# Patient Record
Sex: Female | Born: 1990 | Race: Black or African American | Hispanic: No | Marital: Single | Smoking: Never smoker
Health system: Southern US, Community
[De-identification: ages and names within clinical notes are randomized; demographics above are authoritative.]

## PROBLEM LIST (undated history)

## (undated) ENCOUNTER — Inpatient Hospital Stay (HOSPITAL_COMMUNITY): Payer: Self-pay

## (undated) DIAGNOSIS — D649 Anemia, unspecified: Secondary | ICD-10-CM

## (undated) DIAGNOSIS — Z5189 Encounter for other specified aftercare: Secondary | ICD-10-CM

## (undated) DIAGNOSIS — T8859XA Other complications of anesthesia, initial encounter: Secondary | ICD-10-CM

## (undated) HISTORY — PX: NO PAST SURGERIES: SHX2092

---

## 2015-12-21 LAB — GLUCOSE, POCT (MANUAL RESULT ENTRY): POC Glucose: 94 mg/dl (ref 70–99)

## 2016-06-03 ENCOUNTER — Encounter (HOSPITAL_COMMUNITY): Payer: Self-pay | Admitting: Emergency Medicine

## 2016-06-03 ENCOUNTER — Ambulatory Visit (HOSPITAL_COMMUNITY)
Admission: EM | Admit: 2016-06-03 | Discharge: 2016-06-03 | Disposition: A | Discharge status: Home / Self Care | Payer: Self-pay | Attending: Family Medicine | Admitting: Family Medicine

## 2016-06-03 DIAGNOSIS — A084 Viral intestinal infection, unspecified: Secondary | ICD-10-CM

## 2016-06-03 MED ORDER — ONDANSETRON 8 MG PO TBDP: 8.0000 mg | ORAL_TABLET | Freq: Three times a day (TID) | ORAL | 0 refills | Status: DC | PRN

## 2016-06-03 NOTE — ED Triage Notes (Signed)
The patient presented to the [facility] with a complaint of abdominal cramping and N/V/D that started today.

## 2016-06-03 NOTE — ED Provider Notes (Signed)
[place]    CSN: [id number] Arrival date & time: 06/03/16  [place]     History   Chief Complaint Chief Complaint  Patient presents with  . Abdominal Cramping    HPI Kimberly Morris is a 26 y.o. female.   The patient presented to the [facility] with a complaint of abdominal cramping and N/V/D that started today. 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 She is a single mother. She left work early today.  Patient's had some cramps along with nausea, vomiting, and diarrhea. Is been no blood in the vomiting or diarrhea. She's tried to take sips of clear liquids but each time she takes even a sip she gets sick. The last time she vomited was about an hour prior to arrival. She has no ongoing problems with her stomach.      History reviewed. No pertinent past medical history.  There are no active problems to display for this patient.   History reviewed. No pertinent surgical history.  OB History    No data available       Home Medications    Prior to Admission medications   Medication Sig Start Date End Date Taking? Authorizing Provider  ondansetron (ZOFRAN-ODT) 8 MG disintegrating tablet Take 1 tablet (8 mg total) by mouth every 8 (eight) hours as needed for nausea. 06/03/16   Robyn Haber, MD    Family History History reviewed. No pertinent family history.  Social History Social History  Substance Use Topics  . Smoking status: Never Smoker  . Smokeless tobacco: Never Used  . Alcohol use Yes     Comment: Occ     Allergies   Patient has no known allergies.   Review of Systems Review of Systems  Constitutional: Positive for fatigue.  HENT: Negative.   Respiratory: Negative.   Cardiovascular: Negative.   Gastrointestinal: Positive for abdominal pain, diarrhea, nausea and vomiting.  Genitourinary: Negative.   Musculoskeletal: Positive for myalgias.  Neurological: Negative.      Physical Exam Triage Vital Signs ED Triage Vitals  Enc  Vitals Group     BP      Pulse      Resp      Temp      Temp src      SpO2      Weight      Height      Head Circumference      Peak Flow      Pain Score      Pain Loc      Pain Edu?      Excl. in [place]?    No data found.   Updated Vital Signs BP 111/64 (BP Location: Left Arm)   Pulse 100   Temp 100.5 F (38.1 C) (Oral)   Resp 16   LMP 05/12/2016 (Approximate)   SpO2 100%    Physical Exam  Constitutional: She is oriented to person, place, and time. She appears well-developed and well-nourished.  HENT:  Right Ear: External ear normal.  Left Ear: External ear normal.  Mouth/Throat: Oropharynx is clear and moist.  Eyes: Conjunctivae and EOM are normal. Pupils are equal, round, and reactive to light.  Neck: Normal range of motion. Neck supple.  Cardiovascular: Normal rate, regular rhythm and normal heart sounds.   Pulmonary/Chest: Effort normal and breath sounds normal.  Abdominal: Soft. Bowel sounds are normal. She exhibits no distension. There is no tenderness. There is no rebound and no guarding.  Musculoskeletal: Normal range  of motion.  Neurological: She is alert and oriented to person, place, and time.  Skin: Skin is warm and dry.  Nursing note and vitals reviewed.    UC Treatments / Results  Labs (all labs ordered are listed, but only abnormal results are displayed) Labs Reviewed - No data to display  EKG  EKG Interpretation None       Radiology No results found.  Procedures Procedures (including critical care time)  Medications Ordered in UC Medications - No data to display   Initial Impression / Assessment and Plan / UC Course  I have reviewed the triage vital signs and the nursing notes.  Pertinent labs & imaging results that were available during my care of the patient were reviewed by me and considered in my medical decision making (see chart for details).     Final Clinical Impressions(s) / UC Diagnoses   Final diagnoses:  Viral  gastroenteritis    New Prescriptions New Prescriptions   ONDANSETRON (ZOFRAN-ODT) 8 MG DISINTEGRATING TABLET    Take 1 tablet (8 mg total) by mouth every 8 (eight) hours as needed for nausea.     Robyn Haber, MD 06/03/16 [id number]

## 2017-01-09 ENCOUNTER — Inpatient Hospital Stay (HOSPITAL_COMMUNITY)
Admission: AD | Admit: 2017-01-09 | Discharge: 2017-01-09 | Disposition: A | Discharge status: Home / Self Care | Payer: Medicaid Other | Source: Ambulatory Visit | Attending: Obstetrics & Gynecology | Admitting: Obstetrics & Gynecology

## 2017-01-09 ENCOUNTER — Encounter (HOSPITAL_COMMUNITY): Payer: Self-pay | Admitting: *Deleted

## 2017-01-09 DIAGNOSIS — K529 Noninfective gastroenteritis and colitis, unspecified: Secondary | ICD-10-CM | POA: Insufficient documentation

## 2017-01-09 DIAGNOSIS — R1010 Upper abdominal pain, unspecified: Secondary | ICD-10-CM | POA: Diagnosis present

## 2017-01-09 DIAGNOSIS — Z3A01 Less than 8 weeks gestation of pregnancy: Secondary | ICD-10-CM | POA: Diagnosis not present

## 2017-01-09 DIAGNOSIS — O9989 Other specified diseases and conditions complicating pregnancy, childbirth and the puerperium: Secondary | ICD-10-CM

## 2017-01-09 DIAGNOSIS — E876 Hypokalemia: Secondary | ICD-10-CM | POA: Diagnosis not present

## 2017-01-09 DIAGNOSIS — O99611 Diseases of the digestive system complicating pregnancy, first trimester: Secondary | ICD-10-CM | POA: Insufficient documentation

## 2017-01-09 HISTORY — DX: Anemia, unspecified: D64.9

## 2017-01-09 LAB — COMPREHENSIVE METABOLIC PANEL
ALBUMIN: 4 g/dL (ref 3.5–5.0)
ALT: 14 U/L (ref 14–54)
AST: 20 U/L (ref 15–41)
Alkaline Phosphatase: 46 U/L (ref 38–126)
Anion gap: 6 (ref 5–15)
BUN: 6 mg/dL (ref 6–20)
CHLORIDE: 108 mmol/L (ref 101–111)
CO2: 25 mmol/L (ref 22–32)
Calcium: 9.1 mg/dL (ref 8.9–10.3)
Creatinine, Ser: 0.64 mg/dL (ref 0.44–1.00)
GFR calc Af Amer: >60 mL/min (ref >60)
Glucose, Bld: 116 mg/dL — ABNORMAL HIGH (ref 65–99)
POTASSIUM: 3.3 mmol/L — AB (ref 3.5–5.1)
Sodium: 139 mmol/L (ref 135–145)
Total Bilirubin: 0.5 mg/dL (ref 0.3–1.2)
Total Protein: 6.9 g/dL (ref 6.5–8.1)

## 2017-01-09 LAB — URINALYSIS, ROUTINE W REFLEX MICROSCOPIC
Bilirubin Urine: NEGATIVE
GLUCOSE, UA: NEGATIVE mg/dL
Hgb urine dipstick: NEGATIVE
Ketones, ur: 5 mg/dL — AB
NITRITE: NEGATIVE
PROTEIN: NEGATIVE mg/dL
Specific Gravity, Urine: 1.025 (ref 1.005–1.030)
pH: 5 (ref 5.0–8.0)

## 2017-01-09 LAB — CBC
HCT: 35.2 % — ABNORMAL LOW (ref 36.0–46.0)
Hemoglobin: 12 g/dL (ref 12.0–15.0)
MCH: 28.5 pg (ref 26.0–34.0)
MCHC: 34.1 g/dL (ref 30.0–36.0)
MCV: 83.6 fL (ref 78.0–100.0)
PLATELETS: 232 10*3/uL (ref 150–400)
RBC: 4.21 MIL/uL (ref 3.87–5.11)
RDW: 15.1 % (ref 11.5–15.5)
WBC: 5.7 10*3/uL (ref 4.0–10.5)

## 2017-01-09 LAB — LIPASE, BLOOD: LIPASE: 22 U/L (ref 11–51)

## 2017-01-09 LAB — POCT PREGNANCY, URINE: PREG TEST UR: POSITIVE — AB

## 2017-01-09 MED ORDER — PROMETHAZINE HCL 25 MG PO TABS: 25.0000 mg | ORAL_TABLET | Freq: Four times a day (QID) | ORAL | 0 refills | Status: DC | PRN

## 2017-01-09 NOTE — Discharge Instructions (Signed)
Hypokalemia Hypokalemia means that the amount of potassium in the blood is lower than normal.Potassium is a chemical that helps regulate the amount of fluid in the body (electrolyte). It also stimulates muscle tightening (contraction) and helps nerves work properly.Normally, most of the bodys potassium is inside of cells, and only a very small amount is in the blood. Because the amount in the blood is so small, minor changes to potassium levels in the blood can be life-threatening. What are the causes? This condition may be caused by:  Antibiotic medicine.  Diarrhea or vomiting. Taking too much of a medicine that helps you have a bowel movement (laxative) can cause diarrhea and lead to hypokalemia.  Chronic kidney disease (CKD).  Medicines that help the body get rid of excess fluid (diuretics).  Eating disorders, such as bulimia.  Low magnesium levels in the body.  Sweating a lot.  What are the signs or symptoms? Symptoms of this condition include:  Weakness.  Constipation.  Fatigue.  Muscle cramps.  Mental confusion.  Skipped heartbeats or irregular heartbeat (palpitations).  Tingling or numbness.  How is this diagnosed? This condition is diagnosed with a blood test. How is this treated? Hypokalemia can be treated by taking potassium supplements by mouth or adjusting the medicines that you take. Treatment may also include eating more foods that contain a lot of potassium. If your potassium level is very low, you may need to get potassium through an IV tube in one of your veins and be monitored in the hospital. Follow these instructions at home:  Take over-the-counter and prescription medicines only as told by your health care provider. This includes vitamins and supplements.  Eat a healthy diet. A healthy diet includes fresh fruits and vegetables, whole grains, healthy fats, and lean proteins.  If instructed, eat more foods that contain a lot of potassium, such  as: ? Nuts, such as peanuts and pistachios. ? Seeds, such as sunflower seeds and pumpkin seeds. ? Peas, lentils, and lima beans. ? Whole grain and bran cereals and breads. ? Fresh fruits and vegetables, such as apricots, avocado, bananas, cantaloupe, kiwi, oranges, tomatoes, asparagus, and potatoes. ? Orange juice. ? Tomato juice. ? Red meats. ? Yogurt.  Keep all follow-up visits as told by your health care provider. This is important. Contact a health care provider if:  You have weakness that gets worse.  You feel your heart pounding or racing.  You vomit.  You have diarrhea.  You have diabetes (diabetes mellitus) and you have trouble keeping your blood sugar (glucose) in your target range. Get help right away if:  You have chest pain.  You have shortness of breath.  You have vomiting or diarrhea that lasts for more than 2 days.  You faint. This information is not intended to replace advice given to you by your health care provider. Make sure you discuss any questions you have with your health care provider. Document Released: 03/24/2005 Document Revised: 11/10/2015 Document Reviewed: 11/10/2015 Elsevier Interactive Patient Education  2018 [place] Choices to Help Relieve Diarrhea, Adult When you have diarrhea, the foods you eat and your eating habits are very important. Choosing the right foods and drinks can help:  Relieve diarrhea.  Replace lost fluids and nutrients.  Prevent dehydration.  What general guidelines should I follow? Relieving diarrhea  Choose foods with less than 2 g or .07 oz. of fiber per serving.  Limit fats to less than 8 tsp (38 g or 1.34 oz.) a day.  Avoid the following: ? Foods and beverages sweetened with high-fructose corn syrup, honey, or sugar alcohols such as xylitol, sorbitol, and mannitol. ? Foods that contain a lot of fat or sugar. ? Fried, greasy, or spicy foods. ? High-fiber grains, breads, and cereals. ? Raw fruits  and vegetables.  Eat foods that are rich in probiotics. These foods include dairy products such as yogurt and fermented milk products. They help increase healthy bacteria in the stomach and intestines (gastrointestinal tract, or GI tract).  If you have lactose intolerance, avoid dairy products. These may make your diarrhea worse.  Take medicine to help stop diarrhea (antidiarrheal medicine) only as told by your health care provider. Replacing nutrients  Eat small meals or snacks every 3-4 hours.  Eat bland foods, such as white rice, toast, or baked potato, until your diarrhea starts to get better. Gradually reintroduce nutrient-rich foods as tolerated or as told by your health care provider. This includes: ? Well-cooked protein foods. ? Peeled, seeded, and soft-cooked fruits and vegetables. ? Low-fat dairy products.  Take vitamin and mineral supplements as told by your health care provider. Preventing dehydration   Start by sipping water or a special solution to prevent dehydration (oral rehydration solution, ORS). Urine that is clear or pale yellow means that you are getting enough fluid.  Try to drink at least 8-10 cups of fluid each day to help replace lost fluids.  You may add other liquids in addition to water, such as clear juice or decaffeinated sports drinks, as tolerated or as told by your health care provider.  Avoid drinks with caffeine, such as coffee, tea, or soft drinks.  Avoid alcohol. What foods are recommended? The items listed may not be a complete list. Talk with your health care provider about what dietary choices are best for you. Grains White rice. White, [place], or pita breads (fresh or toasted), including plain rolls, buns, or bagels. White pasta. Saltine, soda, or graham crackers. Pretzels. Low-fiber cereal. Cooked cereals made with water (such as cornmeal, farina, or cream cereals). Plain muffins. Matzo. Melba toast. Zwieback. Vegetables Potatoes (without  the skin). Most well-cooked and canned vegetables without skins or seeds. Tender lettuce. Fruits Apple sauce. Fruits canned in juice. Cooked apricots, cherries, grapefruit, peaches, pears, or plums. Fresh bananas and cantaloupe. Meats and other protein foods Baked or boiled chicken. Eggs. Tofu. Fish. Seafood. Smooth nut butters. Ground or well-cooked tender beef, ham, veal, lamb, pork, or poultry. Dairy Plain yogurt, kefir, and unsweetened liquid yogurt. Lactose-free milk, buttermilk, skim milk, or soy milk. Low-fat or nonfat hard cheese. Beverages Water. Low-calorie sports drinks. Fruit juices without pulp. Strained tomato and vegetable juices. Decaffeinated teas. Sugar-free beverages not sweetened with sugar alcohols. Oral rehydration solutions, if approved by your health care provider. Seasoning and other foods Bouillon, broth, or soups made from recommended foods. What foods are not recommended? The items listed may not be a complete list. Talk with your health care provider about what dietary choices are best for you. Grains Whole grain, whole wheat, bran, or rye breads, rolls, pastas, and crackers. Wild or brown rice. Whole grain or bran cereals. Barley. Oats and oatmeal. Corn tortillas or taco shells. Granola. Popcorn. Vegetables Raw vegetables. Fried vegetables. Cabbage, broccoli, Brussels sprouts, artichokes, baked beans, beet greens, corn, kale, legumes, peas, sweet potatoes, and yams. Potato skins. Cooked spinach and cabbage. Fruits Dried fruit, including raisins and dates. Raw fruits. Stewed or dried prunes. Canned fruits with syrup. Meat and other protein foods Fried or fatty  meats. Deli meats. Chunky nut butters. Nuts and seeds. Beans and lentils. Berniece Salines. Hot dogs. Sausage. Dairy High-fat cheeses. Whole milk, chocolate milk, and beverages made with milk, such as milk shakes. Half-and-half. Cream. sour cream. Ice cream. Beverages Caffeinated beverages (such as coffee, tea, soda,  or energy drinks). Alcoholic beverages. Fruit juices with pulp. Prune juice. Soft drinks sweetened with high-fructose corn syrup or sugar alcohols. High-calorie sports drinks. Fats and oils Butter. Cream sauces. Margarine. Salad oils. Plain salad dressings. Olives. Avocados. Mayonnaise. Sweets and desserts Sweet rolls, doughnuts, and sweet breads. Sugar-free desserts sweetened with sugar alcohols such as xylitol and sorbitol. Seasoning and other foods Honey. Hot sauce. Chili powder. Gravy. Cream-based or milk-based soups. Pancakes and waffles. Summary  When you have diarrhea, the foods you eat and your eating habits are very important.  Make sure you get at least 8-10 cups of fluid each day, or enough to keep your urine clear or pale yellow.  Eat bland foods and gradually reintroduce healthy, nutrient-rich foods as tolerated, or as told by your health care provider.  Avoid high-fiber, fried, greasy, or spicy foods. This information is not intended to replace advice given to you by your health care provider. Make sure you discuss any questions you have with your health care provider. Document Released: 06/14/2003 Document Revised: 03/21/2016 Document Reviewed: 03/21/2016 Elsevier Interactive Patient Education  2017 Elsevier Inc. Viral Gastroenteritis, Adult Viral gastroenteritis is also known as the stomach flu. This condition is caused by certain germs (viruses). These germs can be passed from person to person very easily (are very contagious). This condition can cause sudden watery poop (diarrhea), fever, and throwing up (vomiting). Having watery poop and throwing up can make you feel weak and cause you to get dehydrated. Dehydration can make you tired and thirsty, make you have a dry mouth, and make it so you pee (urinate) less often. Older adults and people with other diseases or a weak defense system (immune system) are at higher risk for dehydration. It is important to replace the fluids  that you lose from having watery poop and throwing up. Follow these instructions at home: Follow instructions from your doctor about how to care for yourself at home. Eating and drinking  Follow these instructions as told by your doctor:  Take an oral rehydration solution (ORS). This is a drink that is sold at pharmacies and stores.  Drink clear fluids in small amounts as you are able, such as: ? Water. ? Ice chips. ? Diluted fruit juice. ? Low-calorie sports drinks.  Eat bland, easy-to-digest foods in small amounts as you are able, such as: ? Bananas. ? Applesauce. ? Rice. ? Low-fat (lean) meats. ? Toast. ? Crackers.  Avoid fluids that have a lot of sugar or caffeine in them.  Avoid alcohol.  Avoid spicy or fatty foods.  General instructions  Drink enough fluid to keep your pee (urine) clear or pale yellow.  Wash your hands often. If you cannot use soap and water, use hand sanitizer.  Make sure that all people in your home wash their hands well and often.  Rest at home while you get better.  Take over-the-counter and prescription medicines only as told by your doctor.  Watch your condition for any changes.  Take a warm bath to help with any burning or pain from having watery poop.  Keep all follow-up visits as told by your doctor. This is important. Contact a doctor if:  You cannot keep fluids down.  Your symptoms  get worse.  You have new symptoms.  You feel light-headed or dizzy.  You have muscle cramps. Get help right away if:  You have chest pain.  You feel very weak or you pass out (faint).  You see blood in your throw-up.  Your throw-up looks like coffee grounds.  You have bloody or black poop (stools) or poop that look like tar.  You have a very bad headache, a stiff neck, or both.  You have a rash.  You have very bad pain, cramping, or bloating in your belly (abdomen).  You have trouble breathing.  You are breathing very  quickly.  Your heart is beating very quickly.  Your skin feels cold and clammy.  You feel confused.  You have pain when you pee.  You have signs of dehydration, such as: ? Dark pee, hardly any pee, or no pee. ? Cracked lips. ? Dry mouth. ? Sunken eyes. ? Sleepiness. ? Weakness. This information is not intended to replace advice given to you by your health care provider. Make sure you discuss any questions you have with your health care provider. Document Released: 09/10/2007 Document Revised: 10/12/2015 Document Reviewed: 11/28/2014 Elsevier Interactive Patient Education  2017 [employer].

## 2017-01-09 NOTE — MAU Note (Addendum)
Thinks she has a UTI. Last couple wks, frequency and urgency, initially some pain with urination- none now. Was vomiting this morning.  Burning pain in upper abd. Period is 2 wks late.  Has not done a test.  Was up during the night with diarrhea, none since 0700.

## 2017-01-09 NOTE — MAU Provider Note (Signed)
[place]     CSN: [id number]  Arrival date and time: 01/09/17 1430   First Provider Initiated Contact with Patient 01/09/17 1615      Chief Complaint  Patient presents with  . Emesis  . Abdominal Pain  . Possible Pregnancy  . Urinary Frequency   [id number] @[redacted]w[redacted]d  by LMP here with N/V/D and upper abdominal pain. Sx occurred this am between 0400-0700. She had 4 episodes of vomiting and 3 episodes of diarrhea. The pain was in the epigastric region and describes as burning. She has eaten french fries and had water since and was able to tolerate. No fevers. Her boyfriend had diarrhea 3 days ago. She also reports dysuria and urgency that started about 2 weeks ago. The dysuria has since resolved but still having urgency. Denies VB or lower abdominal pain. She thinks she may be pregnant.   Past Medical History:  Diagnosis Date  . Anemia     Past Surgical History:  Procedure Laterality Date  . NO PAST SURGERIES      History reviewed. No pertinent family history.  Social History  Substance Use Topics  . Smoking status: Never Smoker  . Smokeless tobacco: Never Used  . Alcohol use Yes     Comment: Occ    Allergies: No Known Allergies  Prescriptions Prior to Admission  Medication Sig Dispense Refill Last Dose  . ondansetron (ZOFRAN-ODT) 8 MG disintegrating tablet Take 1 tablet (8 mg total) by mouth every 8 (eight) hours as needed for nausea. 12 tablet 0     Review of Systems  Constitutional: Negative for fever.  Gastrointestinal: Positive for abdominal pain, diarrhea, nausea and vomiting.  Genitourinary: Negative for vaginal bleeding.   Physical Exam   Blood pressure 110/61, pulse (!) 101, temperature 98.9 F (37.2 C), temperature source Oral, resp. rate 16, height 5' 2.5" (1.588 m), weight 136 lb 12 oz (62 kg), last menstrual period 12/04/2016, SpO2 100 %.  Physical Exam  Constitutional: She is oriented to person, place, and time. She appears well-developed and  well-nourished.  HENT:  Head: Normocephalic and atraumatic.  Neck: Normal range of motion.  Respiratory: Effort normal.  GI: Soft. She exhibits no distension and no mass. There is no tenderness. There is no rebound and no guarding.  Musculoskeletal: Normal range of motion.  Neurological: She is alert and oriented to person, place, and time.  Skin: Skin is warm and dry.  Psychiatric: She has a normal mood and affect.   Results for orders placed or performed during the hospital encounter of 01/09/17 (from the past 24 hour(s))  Urinalysis, Routine w reflex microscopic     Status: Abnormal   Collection Time: 01/09/17  2:53 PM  Result Value Ref Range   Color, Urine YELLOW YELLOW   APPearance CLOUDY (A) CLEAR   Specific Gravity, Urine 1.025 1.005 - 1.030   pH 5.0 5.0 - 8.0   Glucose, UA NEGATIVE NEGATIVE mg/dL   Hgb urine dipstick NEGATIVE NEGATIVE   Bilirubin Urine NEGATIVE NEGATIVE   Ketones, ur 5 (A) NEGATIVE mg/dL   Protein, ur NEGATIVE NEGATIVE mg/dL   Nitrite NEGATIVE NEGATIVE   Leukocytes, UA MODERATE (A) NEGATIVE   RBC / HPF 0-5 0 - 5 RBC/hpf   WBC, UA 6-30 0 - 5 WBC/hpf   Bacteria, UA FEW (A) NONE SEEN   Squamous Epithelial / LPF TOO NUMEROUS TO COUNT (A) NONE SEEN   Mucus PRESENT    Sperm, UA PRESENT   Pregnancy, urine POC  Status: Abnormal   Collection Time: 01/09/17  3:19 PM  Result Value Ref Range   Preg Test, Ur POSITIVE (A) NEGATIVE  CBC     Status: Abnormal   Collection Time: 01/09/17  3:51 PM  Result Value Ref Range   WBC 5.7 4.0 - 10.5 K/uL   RBC 4.21 3.87 - 5.11 MIL/uL   Hemoglobin 12.0 12.0 - 15.0 g/dL   HCT 35.2 (L) 36.0 - 46.0 %   MCV 83.6 78.0 - 100.0 fL   MCH 28.5 26.0 - 34.0 pg   MCHC 34.1 30.0 - 36.0 g/dL   RDW 15.1 11.5 - 15.5 %   Platelets 232 150 - 400 K/uL  Comprehensive metabolic panel     Status: Abnormal   Collection Time: 01/09/17  3:51 PM  Result Value Ref Range   Sodium 139 135 - 145 mmol/L   Potassium 3.3 (L) 3.5 - 5.1 mmol/L    Chloride 108 101 - 111 mmol/L   CO2 25 22 - 32 mmol/L   Glucose, Bld 116 (H) 65 - 99 mg/dL   BUN 6 6 - 20 mg/dL   Creatinine, Ser 0.64 0.44 - 1.00 mg/dL   Calcium 9.1 8.9 - 10.3 mg/dL   Total Protein 6.9 6.5 - 8.1 g/dL   Albumin 4.0 3.5 - 5.0 g/dL   AST 20 15 - 41 U/L   ALT 14 14 - 54 U/L   Alkaline Phosphatase 46 38 - 126 U/L   Total Bilirubin 0.5 0.3 - 1.2 mg/dL   GFR calc non Af Amer >60 >60 mL/min   GFR calc Af Amer >60 >60 mL/min   Anion gap 6 5 - 15  Lipase, blood     Status: None   Collection Time: 01/09/17  3:51 PM  Result Value Ref Range   Lipase 22 11 - 51 U/L   MAU Course  Procedures  MDM Labs ordered and reviewed. Pt currently asymptomatic. Likely viral gastroenteritis. Discussed support measures. Rx for Phenergan provided. May use Imodium OTC prn. Stable for discharge home.  Assessment and Plan   1. [redacted] weeks gestation of pregnancy   2. Gastroenteritis   3. Hypokalemia    Discharge home Follow up with OBGYN provider of choice Rx Phenergan BRAT diet Consume bananas to increase K  Allergies as of 01/09/2017   No Known Allergies     Medication List    STOP taking these medications   ondansetron 8 MG disintegrating tablet Commonly known as:  ZOFRAN-ODT     TAKE these medications   promethazine 25 MG tablet Commonly known as:  PHENERGAN Take 1 tablet (25 mg total) by mouth every 6 (six) hours as needed for nausea or vomiting.      Julianne Handler, CNM 01/09/2017, 4:48 PM

## 2017-01-09 NOTE — MAU Note (Signed)
Pt presents with c/o vomiting and diarrhea this morning.  Reports vomited x4 and 3 episodes of diarrhea.  Pt also reports signs of UTI, reports frequency & urgency, denies burning.

## 2017-01-10 LAB — CULTURE, OB URINE

## 2017-02-12 LAB — OB RESULTS CONSOLE GC/CHLAMYDIA: GC PROBE AMP, GENITAL: NEGATIVE

## 2017-02-12 LAB — OB RESULTS CONSOLE HIV ANTIBODY (ROUTINE TESTING): HIV: NONREACTIVE

## 2017-02-12 LAB — OB RESULTS CONSOLE HEPATITIS B SURFACE ANTIGEN: Hepatitis B Surface Ag: NEGATIVE

## 2017-02-12 LAB — OB RESULTS CONSOLE RUBELLA ANTIBODY, IGM: RUBELLA: IMMUNE

## 2017-05-28 LAB — OB RESULTS CONSOLE RPR: RPR: NONREACTIVE

## 2017-08-06 LAB — OB RESULTS CONSOLE GBS: GBS: NEGATIVE

## 2017-09-01 ENCOUNTER — Other Ambulatory Visit: Payer: Self-pay

## 2017-09-01 ENCOUNTER — Inpatient Hospital Stay (HOSPITAL_COMMUNITY)
Admission: AD | Admit: 2017-09-01 | Discharge: 2017-09-01 | Disposition: A | Discharge status: Home / Self Care | Payer: Medicaid Other | Source: Ambulatory Visit | Attending: Family Medicine | Admitting: Family Medicine

## 2017-09-01 ENCOUNTER — Encounter (HOSPITAL_COMMUNITY): Payer: Self-pay

## 2017-09-01 DIAGNOSIS — O479 False labor, unspecified: Secondary | ICD-10-CM | POA: Diagnosis not present

## 2017-09-01 DIAGNOSIS — Z3A00 Weeks of gestation of pregnancy not specified: Secondary | ICD-10-CM | POA: Diagnosis not present

## 2017-09-01 NOTE — MAU Note (Signed)
Pt reports she was having some nausea and some bloody discharge on the tissue at around 4, lower back pain off/on. Was told to come in for eval.

## 2017-09-01 NOTE — Progress Notes (Addendum)
G6P3 @ 38.[redacted] wksga. Here for labor eval with c/o bloody vaginal discharge and lower back pain that comes and goes. +FM  efm applied.   VE: 1/thick/high . No blood noted   1837: Provider notified. Report given. Orders received to d/c pt home on labor precaution.   1847: D/c instructions given with pt understanding. Pt left unit via ambulatory with SO.

## 2017-09-01 NOTE — Discharge Instructions (Signed)
Braxton Hicks Contractions °Contractions of the uterus can occur throughout pregnancy, but they are not always a sign that you are in labor. You may have practice contractions called Braxton Hicks contractions. These false labor contractions are sometimes confused with true labor. °What are Braxton Hicks contractions? °Braxton Hicks contractions are tightening movements that occur in the muscles of the uterus before labor. Unlike true labor contractions, these contractions do not result in opening (dilation) and thinning of the cervix. Toward the end of pregnancy (32-34 weeks), Braxton Hicks contractions can happen more often and may become stronger. These contractions are sometimes difficult to tell apart from true labor because they can be very uncomfortable. You should not feel embarrassed if you go to the hospital with false labor. °Sometimes, the only way to tell if you are in true labor is for your health care provider to look for changes in the cervix. The health care provider will do a physical exam and may monitor your contractions. If you are not in true labor, the exam should show that your cervix is not dilating and your water has not broken. °If there are other health problems associated with your pregnancy, it is completely safe for you to be sent home with false labor. You may continue to have Braxton Hicks contractions until you go into true labor. °How to tell the difference between true labor and false labor °True labor °· Contractions last 30-70 seconds. °· Contractions become very regular. °· Discomfort is usually felt in the top of the uterus, and it spreads to the lower abdomen and low back. °· Contractions do not go away with walking. °· Contractions usually become more intense and increase in frequency. °· The cervix dilates and gets thinner. °False labor °· Contractions are usually shorter and not as strong as true labor contractions. °· Contractions are usually irregular. °· Contractions  are often felt in the front of the lower abdomen and in the groin. °· Contractions may go away when you walk around or change positions while lying down. °· Contractions get weaker and are shorter-lasting as time goes on. °· The cervix usually does not dilate or become thin. °Follow these instructions at home: °· Take over-the-counter and prescription medicines only as told by your health care provider. °· Keep up with your usual exercises and follow other instructions from your health care provider. °· Eat and drink lightly if you think you are going into labor. °· If Braxton Hicks contractions are making you uncomfortable: °? Change your position from lying down or resting to walking, or change from walking to resting. °? Sit and rest in a tub of warm water. °? Drink enough fluid to keep your urine pale yellow. Dehydration may cause these contractions. °? Do slow and deep breathing several times an hour. °· Keep all follow-up prenatal visits as told by your health care provider. This is important. °Contact a health care provider if: °· You have a fever. °· You have continuous pain in your abdomen. °Get help right away if: °· Your contractions become stronger, more regular, and closer together. °· You have fluid leaking or gushing from your vagina. °· You pass blood-tinged mucus (bloody show). °· You have bleeding from your vagina. °· You have low back pain that you never had before. °· You feel your baby’s head pushing down and causing pelvic pressure. °· Your baby is not moving inside you as much as it used to. °Summary °· Contractions that occur before labor are called Braxton   Hicks contractions, false labor, or practice contractions. °· Braxton Hicks contractions are usually shorter, weaker, farther apart, and less regular than true labor contractions. True labor contractions usually become progressively stronger and regular and they become more frequent. °· Manage discomfort from Braxton Hicks contractions by  changing position, resting in a warm bath, drinking plenty of water, or practicing deep breathing. °This information is not intended to replace advice given to you by your health care provider. Make sure you discuss any questions you have with your health care provider. °Document Released: 08/07/2016 Document Revised: 08/07/2016 Document Reviewed: 08/07/2016 °Elsevier Interactive Patient Education © 2018 [employer] Inc[employer] ° °

## 2017-09-09 ENCOUNTER — Encounter (HOSPITAL_COMMUNITY): Payer: Self-pay | Admitting: *Deleted

## 2017-09-09 ENCOUNTER — Inpatient Hospital Stay (HOSPITAL_COMMUNITY)
Admission: AD | Admit: 2017-09-09 | Discharge: 2017-09-09 | Disposition: A | Discharge status: Home / Self Care | Payer: Medicaid Other | Source: Ambulatory Visit | Attending: Obstetrics and Gynecology | Admitting: Obstetrics and Gynecology

## 2017-09-09 ENCOUNTER — Other Ambulatory Visit: Payer: Self-pay

## 2017-09-09 DIAGNOSIS — O479 False labor, unspecified: Secondary | ICD-10-CM

## 2017-09-09 DIAGNOSIS — O471 False labor at or after 37 completed weeks of gestation: Secondary | ICD-10-CM

## 2017-09-09 DIAGNOSIS — Z3A39 39 weeks gestation of pregnancy: Secondary | ICD-10-CM | POA: Diagnosis not present

## 2017-09-09 NOTE — MAU Note (Signed)
PT SAYS SHE UC  STRONG SINCE [phone].   [place]- WITH FAMINA.   VE IN MAU- 1 CM.  DENIES HSV AND MRSA.  GBS- UNSURE.

## 2017-09-09 NOTE — Discharge Instructions (Signed)
Braxton Hicks Contractions °Contractions of the uterus can occur throughout pregnancy, but they are not always a sign that you are in labor. You may have practice contractions called Braxton Hicks contractions. These false labor contractions are sometimes confused with true labor. °What are Braxton Hicks contractions? °Braxton Hicks contractions are tightening movements that occur in the muscles of the uterus before labor. Unlike true labor contractions, these contractions do not result in opening (dilation) and thinning of the cervix. Toward the end of pregnancy (32-34 weeks), Braxton Hicks contractions can happen more often and may become stronger. These contractions are sometimes difficult to tell apart from true labor because they can be very uncomfortable. You should not feel embarrassed if you go to the hospital with false labor. °Sometimes, the only way to tell if you are in true labor is for your health care provider to look for changes in the cervix. The health care provider will do a physical exam and may monitor your contractions. If you are not in true labor, the exam should show that your cervix is not dilating and your water has not broken. °If there are other health problems associated with your pregnancy, it is completely safe for you to be sent home with false labor. You may continue to have Braxton Hicks contractions until you go into true labor. °How to tell the difference between true labor and false labor °True labor °· Contractions last 30-70 seconds. °· Contractions become very regular. °· Discomfort is usually felt in the top of the uterus, and it spreads to the lower abdomen and low back. °· Contractions do not go away with walking. °· Contractions usually become more intense and increase in frequency. °· The cervix dilates and gets thinner. °False labor °· Contractions are usually shorter and not as strong as true labor contractions. °· Contractions are usually irregular. °· Contractions  are often felt in the front of the lower abdomen and in the groin. °· Contractions may go away when you walk around or change positions while lying down. °· Contractions get weaker and are shorter-lasting as time goes on. °· The cervix usually does not dilate or become thin. °Follow these instructions at home: °· Take over-the-counter and prescription medicines only as told by your health care provider. °· Keep up with your usual exercises and follow other instructions from your health care provider. °· Eat and drink lightly if you think you are going into labor. °· If Braxton Hicks contractions are making you uncomfortable: °? Change your position from lying down or resting to walking, or change from walking to resting. °? Sit and rest in a tub of warm water. °? Drink enough fluid to keep your urine pale yellow. Dehydration may cause these contractions. °? Do slow and deep breathing several times an hour. °· Keep all follow-up prenatal visits as told by your health care provider. This is important. °Contact a health care provider if: °· You have a fever. °· You have continuous pain in your abdomen. °Get help right away if: °· Your contractions become stronger, more regular, and closer together. °· You have fluid leaking or gushing from your vagina. °· You pass blood-tinged mucus (bloody show). °· You have bleeding from your vagina. °· You have low back pain that you never had before. °· You feel your baby’s head pushing down and causing pelvic pressure. °· Your baby is not moving inside you as much as it used to. °Summary °· Contractions that occur before labor are called Braxton   Hicks contractions, false labor, or practice contractions. °· Braxton Hicks contractions are usually shorter, weaker, farther apart, and less regular than true labor contractions. True labor contractions usually become progressively stronger and regular and they become more frequent. °· Manage discomfort from Braxton Hicks contractions by  changing position, resting in a warm bath, drinking plenty of water, or practicing deep breathing. °This information is not intended to replace advice given to you by your health care provider. Make sure you discuss any questions you have with your health care provider. °Document Released: 08/07/2016 Document Revised: 08/07/2016 Document Reviewed: 08/07/2016 °Elsevier Interactive Patient Education © 2018 [employer] Inc[employer] ° °

## 2017-09-13 ENCOUNTER — Other Ambulatory Visit: Payer: Self-pay

## 2017-09-13 ENCOUNTER — Inpatient Hospital Stay (HOSPITAL_COMMUNITY): Payer: Medicaid Other | Admitting: Anesthesiology

## 2017-09-13 ENCOUNTER — Inpatient Hospital Stay (HOSPITAL_COMMUNITY)
Admission: AD | Admit: 2017-09-13 | Discharge: 2017-09-17 | DRG: 788 | Disposition: A | Discharge status: Home / Self Care | Payer: Medicaid Other | Attending: Obstetrics and Gynecology | Admitting: Obstetrics and Gynecology

## 2017-09-13 ENCOUNTER — Encounter (HOSPITAL_COMMUNITY): Payer: Self-pay

## 2017-09-13 DIAGNOSIS — O3413 Maternal care for benign tumor of corpus uteri, third trimester: Secondary | ICD-10-CM | POA: Diagnosis present

## 2017-09-13 DIAGNOSIS — IMO0002 Reserved for concepts with insufficient information to code with codable children: Secondary | ICD-10-CM

## 2017-09-13 DIAGNOSIS — D649 Anemia, unspecified: Secondary | ICD-10-CM | POA: Diagnosis present

## 2017-09-13 DIAGNOSIS — Z3490 Encounter for supervision of normal pregnancy, unspecified, unspecified trimester: Secondary | ICD-10-CM

## 2017-09-13 DIAGNOSIS — Z3A40 40 weeks gestation of pregnancy: Secondary | ICD-10-CM

## 2017-09-13 DIAGNOSIS — O368130 Decreased fetal movements, third trimester, not applicable or unspecified: Secondary | ICD-10-CM | POA: Diagnosis present

## 2017-09-13 DIAGNOSIS — D252 Subserosal leiomyoma of uterus: Secondary | ICD-10-CM | POA: Diagnosis present

## 2017-09-13 DIAGNOSIS — O34219 Maternal care for unspecified type scar from previous cesarean delivery: Secondary | ICD-10-CM

## 2017-09-13 DIAGNOSIS — O9902 Anemia complicating childbirth: Secondary | ICD-10-CM | POA: Diagnosis present

## 2017-09-13 DIAGNOSIS — O368390 Maternal care for abnormalities of the fetal heart rate or rhythm, unspecified trimester, not applicable or unspecified: Secondary | ICD-10-CM

## 2017-09-13 LAB — URINALYSIS, ROUTINE W REFLEX MICROSCOPIC
Bilirubin Urine: NEGATIVE
GLUCOSE, UA: NEGATIVE mg/dL
Hgb urine dipstick: NEGATIVE
KETONES UR: NEGATIVE mg/dL
Nitrite: NEGATIVE
Protein, ur: NEGATIVE mg/dL
Specific Gravity, Urine: 1.003 — ABNORMAL LOW (ref 1.005–1.030)
pH: 7 (ref 5.0–8.0)

## 2017-09-13 LAB — TYPE AND SCREEN
ABO/RH(D): O POS
ANTIBODY SCREEN: NEGATIVE

## 2017-09-13 LAB — CBC
HCT: 30.7 % — ABNORMAL LOW (ref 36.0–46.0)
Hemoglobin: 10.5 g/dL — ABNORMAL LOW (ref 12.0–15.0)
MCH: 28.5 pg (ref 26.0–34.0)
MCHC: 34.2 g/dL (ref 30.0–36.0)
MCV: 83.4 fL (ref 78.0–100.0)
PLATELETS: 176 10*3/uL (ref 150–400)
RBC: 3.68 MIL/uL — AB (ref 3.87–5.11)
RDW: 17.5 % — AB (ref 11.5–15.5)
WBC: 5.6 10*3/uL (ref 4.0–10.5)

## 2017-09-13 LAB — ABO/RH: ABO/RH(D): O POS

## 2017-09-13 MED ORDER — LACTATED RINGERS IV BOLUS
1000.0000 mL | Freq: Once | INTRAVENOUS | Status: AC
  Administered 2017-09-13: 1000 mL via INTRAVENOUS

## 2017-09-13 MED ORDER — OXYTOCIN BOLUS FROM INFUSION: 500.0000 mL | Freq: Once | INTRAVENOUS | Status: DC

## 2017-09-13 MED ORDER — FENTANYL 2.5 MCG/ML BUPIVACAINE 1/10 % EPIDURAL INFUSION (WH - ANES)
14.0000 mL/h | INTRAMUSCULAR | Status: DC | PRN
  Administered 2017-09-13 (×2): 14 mL/h via EPIDURAL
  Filled 2017-09-13 (×2): qty 100

## 2017-09-13 MED ORDER — PHENYLEPHRINE 40 MCG/ML (10ML) SYRINGE FOR IV PUSH (FOR BLOOD PRESSURE SUPPORT)
80.0000 ug | PREFILLED_SYRINGE | INTRAVENOUS | Status: DC | PRN
  Filled 2017-09-13: qty 10

## 2017-09-13 MED ORDER — EPHEDRINE 5 MG/ML INJ: 10.0000 mg | INTRAVENOUS | Status: DC | PRN

## 2017-09-13 MED ORDER — LACTATED RINGERS IV SOLN
INTRAVENOUS | Status: DC
  Administered 2017-09-13: 22:00:00 via INTRAUTERINE

## 2017-09-13 MED ORDER — PHENYLEPHRINE 40 MCG/ML (10ML) SYRINGE FOR IV PUSH (FOR BLOOD PRESSURE SUPPORT): 80.0000 ug | PREFILLED_SYRINGE | INTRAVENOUS | Status: DC | PRN

## 2017-09-13 MED ORDER — LACTATED RINGERS IV SOLN
500.0000 mL | INTRAVENOUS | Status: DC | PRN
  Administered 2017-09-13: 500 mL via INTRAVENOUS
  Administered 2017-09-13: 999 mL via INTRAVENOUS

## 2017-09-13 MED ORDER — LACTATED RINGERS IV SOLN
500.0000 mL | Freq: Once | INTRAVENOUS | Status: AC
  Administered 2017-09-13: 500 mL via INTRAVENOUS

## 2017-09-13 MED ORDER — LACTATED RINGERS IV SOLN
INTRAVENOUS | Status: DC
  Administered 2017-09-13: 21:00:00 via INTRAVENOUS

## 2017-09-13 MED ORDER — OXYTOCIN 40 UNITS IN LACTATED RINGERS INFUSION - SIMPLE MED: 2.5000 [IU]/h | INTRAVENOUS | Status: DC

## 2017-09-13 MED ORDER — ONDANSETRON HCL 4 MG/2ML IJ SOLN: 4.0000 mg | Freq: Four times a day (QID) | INTRAMUSCULAR | Status: DC | PRN

## 2017-09-13 MED ORDER — TERBUTALINE SULFATE 1 MG/ML IJ SOLN
0.2500 mg | Freq: Once | INTRAMUSCULAR | Status: AC | PRN
  Administered 2017-09-13: 0.25 mg via SUBCUTANEOUS
  Filled 2017-09-13: qty 1

## 2017-09-13 MED ORDER — ACETAMINOPHEN 325 MG PO TABS: 650.0000 mg | ORAL_TABLET | ORAL | Status: DC | PRN

## 2017-09-13 MED ORDER — LIDOCAINE HCL (PF) 1 % IJ SOLN: 30.0000 mL | INTRAMUSCULAR | Status: DC | PRN

## 2017-09-13 MED ORDER — OXYCODONE-ACETAMINOPHEN 5-325 MG PO TABS: 2.0000 | ORAL_TABLET | ORAL | Status: DC | PRN

## 2017-09-13 MED ORDER — SOD CITRATE-CITRIC ACID 500-334 MG/5ML PO SOLN
30.0000 mL | ORAL | Status: DC | PRN
  Administered 2017-09-13: 30 mL via ORAL
  Filled 2017-09-13: qty 15

## 2017-09-13 MED ORDER — DIPHENHYDRAMINE HCL 50 MG/ML IJ SOLN: 12.5000 mg | INTRAMUSCULAR | Status: DC | PRN

## 2017-09-13 MED ORDER — LIDOCAINE HCL (PF) 1 % IJ SOLN
INTRAMUSCULAR | Status: DC | PRN
  Administered 2017-09-13: 2 mL via EPIDURAL
  Administered 2017-09-13: 3 mL via EPIDURAL
  Administered 2017-09-13: 5 mL via EPIDURAL

## 2017-09-13 MED ORDER — OXYCODONE-ACETAMINOPHEN 5-325 MG PO TABS: 1.0000 | ORAL_TABLET | ORAL | Status: DC | PRN

## 2017-09-13 MED ORDER — OXYTOCIN 40 UNITS IN LACTATED RINGERS INFUSION - SIMPLE MED
1.0000 m[IU]/min | INTRAVENOUS | Status: DC
  Administered 2017-09-13: 2 m[IU]/min via INTRAVENOUS
  Filled 2017-09-13: qty 1000

## 2017-09-13 NOTE — Anesthesia Preprocedure Evaluation (Addendum)
Anesthesia Evaluation  Patient identified by MRN, date of birth, ID band Patient awake    Reviewed: Allergy & Precautions, NPO status , Patient's Chart, lab work & pertinent test results  Airway Mallampati: II  TM Distance: >3 FB Neck ROM: Full    Dental  (+) Teeth Intact, Dental Advisory Given   Pulmonary neg pulmonary ROS,    Pulmonary exam normal breath sounds clear to auscultation       Cardiovascular negative cardio ROS Normal cardiovascular exam Rhythm:Regular Rate:Normal     Neuro/Psych negative neurological ROS     GI/Hepatic negative GI ROS, Neg liver ROS,   Endo/Other  negative endocrine ROS  Renal/GU negative Renal ROS     Musculoskeletal negative musculoskeletal ROS (+)   Abdominal   Peds  Hematology  (+) Blood dyscrasia, anemia , Plt 176k   Anesthesia Other Findings Day of surgery medications reviewed with the patient.  Reproductive/Obstetrics (+) Pregnancy                             Anesthesia Physical Anesthesia Plan  ASA: II and emergent  Anesthesia Plan: Epidural   Post-op Pain Management:    Induction:   PONV Risk Score and Plan: 2 and Treatment may vary due to age or medical condition  Airway Management Planned:   Additional Equipment:   Intra-op Plan:   Post-operative Plan:   Informed Consent: I have reviewed the patients History and Physical, chart, labs and discussed the procedure including the risks, benefits and alternatives for the proposed anesthesia with the patient or authorized representative who has indicated his/her understanding and acceptance.   Dental advisory given  Plan Discussed with:   Anesthesia Plan Comments: (Patient identified. Risks/Benefits/Options discussed with patient including but not limited to bleeding, infection, nerve damage, paralysis, failed block, incomplete pain control, headache, blood pressure changes, nausea,  vomiting, reactions to medication both or allergic, itching and postpartum back pain. Confirmed with bedside nurse the patient's most recent platelet count. Confirmed with patient that they are not currently taking any anticoagulation, have any bleeding history or any family history of bleeding disorders. Patient expressed understanding and wished to proceed. All questions were answered.   **Emergent C-section for fetal decelerations.  Epidural in situ will be dosed for surgical anesthesia.**)       Anesthesia Quick Evaluation

## 2017-09-13 NOTE — Progress Notes (Signed)
Pt continues to have prolonged decels. She is not on pitocin and is only having ctxs q 6-8 min.She has an amnioinfusion in place. The cx is not changing and the head is -3. The tracing is still reactive but baby will not tolerate any ctxs. Discussed with pt and husband. Will proceed to the OR for C/S

## 2017-09-13 NOTE — MAU Provider Note (Signed)
History     CSN: [id number]  Arrival date and time: 09/13/17 1150   First Provider Initiated Contact with Patient 09/13/17 1324      Chief Complaint  Patient presents with  . Decreased Fetal Movement  . Abdominal Pain   HPI   Kimberly Morris is a 27 y.o. female [phone] @ [redacted]w[redacted]d here in MAU with contraction pain and decreased fetal movement. Says the pain is in her upper abdomen and feels like contractions. When the pain comes she rates the pain 10/10. The pain started today around 1045. She has not taken anything for the pain. No bleeding or leaking. + fetal movement, although less.   OB History    Gravida  6   Para  3   Term  3   Preterm  0   AB  2   Living  3     SAB  1   TAB  1   Ectopic      Multiple      Live Births  3           Past Medical History:  Diagnosis Date  . Anemia    previous pregnancy     Past Surgical History:  Procedure Laterality Date  . NO PAST SURGERIES      Family History  Problem Relation Age of Onset  . ADD / ADHD Neg Hx   . Alcohol abuse Neg Hx   . Anxiety disorder Neg Hx   . Arthritis Neg Hx   . Asthma Neg Hx   . Birth defects Neg Hx   . Cancer Neg Hx   . COPD Neg Hx   . Depression Neg Hx   . Diabetes Neg Hx   . Drug abuse Neg Hx   . Early death Neg Hx   . Hearing loss Neg Hx   . Heart disease Neg Hx   . Hyperlipidemia Neg Hx   . Intellectual disability Neg Hx   . Kidney disease Neg Hx   . Learning disabilities Neg Hx   . Obesity Neg Hx   . Miscarriages / Stillbirths Neg Hx   . Stroke Neg Hx   . Vision loss Neg Hx   . Varicose Veins Neg Hx   . Hypertension Neg Hx     Social History   Tobacco Use  . Smoking status: Never Smoker  . Smokeless tobacco: Never Used  Substance Use Topics  . Alcohol use: Not Currently  . Drug use: Not Currently    Types: Marijuana    Allergies: No Known Allergies  Medications Prior to Admission  Medication Sig Dispense Refill Last Dose  . promethazine (PHENERGAN)  25 MG tablet Take 1 tablet (25 mg total) by mouth every 6 (six) hours as needed for nausea or vomiting. 30 tablet 0    Results for orders placed or performed during the hospital encounter of 09/13/17 (from the past 48 hour(s))  Urinalysis, Routine w reflex microscopic     Status: Abnormal   Collection Time: 09/13/17 12:23 PM  Result Value Ref Range   Color, Urine STRAW (A) YELLOW   APPearance HAZY (A) CLEAR   Specific Gravity, Urine 1.003 (L) 1.005 - 1.030   pH 7.0 5.0 - 8.0   Glucose, UA NEGATIVE NEGATIVE mg/dL   Hgb urine dipstick NEGATIVE NEGATIVE   Bilirubin Urine NEGATIVE NEGATIVE   Ketones, ur NEGATIVE NEGATIVE mg/dL   Protein, ur NEGATIVE NEGATIVE mg/dL   Nitrite NEGATIVE NEGATIVE   Leukocytes, UA MODERATE (A)  NEGATIVE   RBC / HPF 0-5 0 - 5 RBC/hpf   WBC, UA 11-20 0 - 5 WBC/hpf   Bacteria, UA RARE (A) NONE SEEN   Squamous Epithelial / LPF 11-20 0 - 5   Mucus PRESENT     Comment: Performed at [facility], [address], [place], [place] [address]   Review of Systems  Constitutional: Negative for fever.  Gastrointestinal: Positive for abdominal pain.  Genitourinary: Negative for vaginal bleeding and vaginal discharge.   Physical Exam   Blood pressure 121/74, pulse 90, temperature 98.3 F (36.8 C), temperature source Oral, resp. rate 18, height 5\' 4"  (1.626 m), weight 164 lb 1.3 oz (74.4 kg), last menstrual period 12/04/2016, SpO2 98 %.  Physical Exam  Constitutional: She is oriented to person, place, and time. She appears well-developed and well-nourished. No distress.  HENT:  Head: Normocephalic.  Eyes: Pupils are equal, round, and reactive to light.  GI: Soft. She exhibits no distension. There is no tenderness. There is no rebound and no guarding.  Genitourinary:  Genitourinary Comments: Dilation: 1.5 Effacement (%): 50 Cervical Position: Posterior Station: Ballotable Presentation: Vertex Exam by:: Chinita Greenland, RNC   Neurological: She is alert and oriented  to person, place, and time.  Skin: Skin is warm. She is not diaphoretic.  Psychiatric: Her behavior is normal.   Fetal Tracing: Baseline: 135 bpm Variability: moderate  Accelerations: None Decelerations: late decelerations  Toco: 2-4 mins  MAU Course  Procedures  None  MDM  RN informed me of fetal tracing, requested RN to turn patient to her left side.  Discussed patient's arrival, fetal tracing an exam with Dr. Ouida Sills At [phone]; will admit to labor and delivery. Category 2 tracing, Dr. Ouida Sills made aware.  Patient notified of admission, and turned to her left side, as patient was laying on her back. IV started.   Assessment and Plan   A:  1. Indication for care in labor and delivery, antepartum   2. Late deceleration of fetal heart rate     P:  Admit to labor and delivery per Dr. Ouida Sills Category 2 fetal tracing.    Noni Saupe I, NP 09/13/2017 2:06 PM

## 2017-09-13 NOTE — H&P (Signed)
Kimberly Morris is an 27 y.o. [phone] [redacted]w[redacted]d black female who presented to the ER earlier today c/o a decrease in FM. In the ER the pt was noted to have a few variable and late decels. The tracing was reactive. She was admitted for induction. Her PNC was uncomplicated.  Chief Complaint: HPI:  Past Medical History:  Diagnosis Date  . Anemia    previous pregnancy     Past Surgical History:  Procedure Laterality Date  . NO PAST SURGERIES      Family History  Problem Relation Age of Onset  . ADD / ADHD Neg Hx   . Alcohol abuse Neg Hx   . Anxiety disorder Neg Hx   . Arthritis Neg Hx   . Asthma Neg Hx   . Birth defects Neg Hx   . Cancer Neg Hx   . COPD Neg Hx   . Depression Neg Hx   . Diabetes Neg Hx   . Drug abuse Neg Hx   . Early death Neg Hx   . Hearing loss Neg Hx   . Heart disease Neg Hx   . Hyperlipidemia Neg Hx   . Intellectual disability Neg Hx   . Kidney disease Neg Hx   . Learning disabilities Neg Hx   . Obesity Neg Hx   . Miscarriages / Stillbirths Neg Hx   . Stroke Neg Hx   . Vision loss Neg Hx   . Varicose Veins Neg Hx   . Hypertension Neg Hx    Social History:  reports that she has never smoked. She has never used smokeless tobacco. She reports that she drank alcohol. She reports that she has current or past drug history. Drug: Marijuana.  Allergies: No Known Allergies  Medications Prior to Admission  Medication Sig Dispense Refill  . ketoconazole (NIZORAL) 2 % cream Apply 1 application topically 2 (two) times daily as needed (ringworm).     . Prenatal Vit-Fe Fumarate-FA (PRENATAL MULTIVITAMIN) TABS tablet Take 1 tablet by mouth daily at 12 noon.    . promethazine (PHENERGAN) 25 MG tablet Take 1 tablet (25 mg total) by mouth every 6 (six) hours as needed for nausea or vomiting. (Patient not taking: Reported on 09/13/2017) 30 tablet 0       Blood pressure 119/70, pulse (!) 109, temperature 98.8 F (37.1 C), temperature source Oral, resp. rate 16, height 5\' 4"   (1.626 m), weight 164 lb 1.3 oz (74.4 kg), last menstrual period 12/04/2016, SpO2 98 %. General appearance: alert and cooperative Abdomen: gravid, non tender   Lab Results  Component Value Date   WBC 5.6 09/13/2017   HGB 10.5 (L) 09/13/2017   HCT 30.7 (L) 09/13/2017   MCV 83.4 09/13/2017   PLT 176 09/13/2017   Lab Results  Component Value Date   PREGTESTUR POSITIVE (A) 01/09/2017       Patient Active Problem List   Diagnosis Date Noted  . Indication for care in labor and delivery, antepartum 09/13/2017  . False labor 09/01/2017   IMP/ IUP at term         Decreased FM         Uterine fibroid Plan/ Admit          Start induction  [place] E 09/13/2017, 10:22 PM

## 2017-09-13 NOTE — Anesthesia Procedure Notes (Signed)
Epidural Patient location during procedure: OB Start time: 09/13/2017 2:57 PM End time: 09/13/2017 3:03 PM  Staffing Anesthesiologist: Catalina Gravel, MD Performed: anesthesiologist   Preanesthetic Checklist Completed: patient identified, pre-op evaluation, timeout performed, IV checked, risks and benefits discussed and monitors and equipment checked  Epidural Patient position: sitting Prep: DuraPrep Patient monitoring: blood pressure and continuous pulse ox Approach: midline Location: L3-L4 Injection technique: LOR air  Needle:  Needle type: Tuohy  Needle gauge: 17 G Needle length: 9 cm Needle insertion depth: 5 cm Catheter size: 19 Gauge Catheter at skin depth: 10 cm Test dose: negative and Other (1% Lidocaine)  Additional Notes Patient identified.  Risk benefits discussed including failed block, incomplete pain control, headache, nerve damage, paralysis, blood pressure changes, nausea, vomiting, reactions to medication both toxic or allergic, and postpartum back pain.  Patient expressed understanding and wished to proceed.  All questions were answered.  Sterile technique used throughout procedure and epidural site dressed with sterile barrier dressing. No paresthesia or other complications noted. The patient did not experience any signs of intravascular injection such as tinnitus or metallic taste in mouth nor signs of intrathecal spread such as rapid motor block. Please see nursing notes for vital signs. Reason for block:procedure for pain

## 2017-09-13 NOTE — MAU Note (Signed)
Patient c/o  +upper mid abdominal pain Started 1.5 hour ago Constant Discomfort and sharp in nature Has not taken anything for pain Rating pain 9/10  +occassional contraction but states is not as worse and pain previous noted above  +decreased fetal movement Reports having only felt baby move x1 this morning; doppler in triage 134  Denies LOF or VB

## 2017-09-13 NOTE — Anesthesia Pain Management Evaluation Note (Signed)
  CRNA Pain Management Visit Note  Patient: Kimberly Morris, 27 y.o., female  "Hello I am a member of the anesthesia team at [facility]. We have an anesthesia team available at all times to provide care throughout the hospital, including epidural management and anesthesia for C-section. I don't know your plan for the delivery whether it a natural birth, water birth, IV sedation, nitrous supplementation, doula or epidural, but we want to meet your pain goals."   1.Was your pain managed to your expectations on prior hospitalizations?   Yes   2.What is your expectation for pain management during this hospitalization?     Epidural  3.How can we help you reach that goal? Epidural in place at time of visit  Record the patient's initial score and the patient's pain goal.   Pain: 1  Pain Goal: 5 [facility] wants you to be able to say your pain was always managed very well.  Rayvon Char 09/13/2017

## 2017-09-13 NOTE — Progress Notes (Addendum)
G6P3 @ 40.[redacted] wksga. Here dt decreased fm and constant pain in her upper abdominal area which she rates pain as 10 that started 1045. Denies LOF or bleeding.   EFM applied  1245: VE: 1.5/50/ballotable.  Provider made aware of the no 15x15 Accels and the 1-late. No orders received. Informed pt turned to side.  1227: Lates noted.  1228: Provider ordered for IV for the lates and to admit pt.   1240: labs drawn. IV started with LR bolus infusing.   1404: Pt to birthing via wheelchair.

## 2017-09-14 ENCOUNTER — Encounter (HOSPITAL_COMMUNITY): Payer: Self-pay

## 2017-09-14 ENCOUNTER — Encounter (HOSPITAL_COMMUNITY)
Admission: AD | Disposition: A | Discharge status: Home / Self Care | Payer: Self-pay | Source: Home / Self Care | Attending: Obstetrics and Gynecology

## 2017-09-14 DIAGNOSIS — Z3490 Encounter for supervision of normal pregnancy, unspecified, unspecified trimester: Secondary | ICD-10-CM

## 2017-09-14 LAB — RPR: RPR Ser Ql: NONREACTIVE

## 2017-09-14 LAB — CBC
HEMATOCRIT: 26.7 % — AB (ref 36.0–46.0)
HEMOGLOBIN: 9 g/dL — AB (ref 12.0–15.0)
MCH: 28.1 pg (ref 26.0–34.0)
MCHC: 33.7 g/dL (ref 30.0–36.0)
MCV: 83.4 fL (ref 78.0–100.0)
Platelets: 161 10*3/uL (ref 150–400)
RBC: 3.2 MIL/uL — AB (ref 3.87–5.11)
RDW: 17.2 % — ABNORMAL HIGH (ref 11.5–15.5)
WBC: 16 10*3/uL — ABNORMAL HIGH (ref 4.0–10.5)

## 2017-09-14 SURGERY — Surgical Case
Anesthesia: Epidural

## 2017-09-14 MED ORDER — CEFAZOLIN SODIUM-DEXTROSE 2-3 GM-%(50ML) IV SOLR
INTRAVENOUS | Status: DC | PRN
  Administered 2017-09-14: 2 g via INTRAVENOUS

## 2017-09-14 MED ORDER — METHYLERGONOVINE MALEATE 0.2 MG/ML IJ SOLN
0.2000 mg | Freq: Once | INTRAMUSCULAR | Status: AC
  Administered 2017-09-14: 0.2 mg via INTRAMUSCULAR

## 2017-09-14 MED ORDER — FENTANYL CITRATE (PF) 100 MCG/2ML IJ SOLN
25.0000 ug | INTRAMUSCULAR | Status: DC | PRN
  Administered 2017-09-14: 50 ug via INTRAVENOUS

## 2017-09-14 MED ORDER — TETANUS-DIPHTH-ACELL PERTUSSIS 5-2.5-18.5 LF-MCG/0.5 IM SUSP: 0.5000 mL | Freq: Once | INTRAMUSCULAR | Status: DC

## 2017-09-14 MED ORDER — MEASLES, MUMPS & RUBELLA VAC ~~LOC~~ INJ
0.5000 mL | INJECTION | Freq: Once | SUBCUTANEOUS | Status: DC
  Filled 2017-09-14: qty 0.5

## 2017-09-14 MED ORDER — FENTANYL CITRATE (PF) 100 MCG/2ML IJ SOLN
INTRAMUSCULAR | Status: AC
  Filled 2017-09-14: qty 2

## 2017-09-14 MED ORDER — COCONUT OIL OIL: 1.0000 "application " | TOPICAL_OIL | Status: DC | PRN

## 2017-09-14 MED ORDER — SIMETHICONE 80 MG PO CHEW
80.0000 mg | CHEWABLE_TABLET | ORAL | Status: DC
  Administered 2017-09-15 – 2017-09-17 (×3): 80 mg via ORAL
  Filled 2017-09-14 (×3): qty 1

## 2017-09-14 MED ORDER — OXYTOCIN 40 UNITS IN LACTATED RINGERS INFUSION - SIMPLE MED: 2.5000 [IU]/h | INTRAVENOUS | Status: AC

## 2017-09-14 MED ORDER — LACTATED RINGERS IV SOLN
INTRAVENOUS | Status: DC | PRN
  Administered 2017-09-14 (×2): via INTRAVENOUS

## 2017-09-14 MED ORDER — OXYTOCIN 10 UNIT/ML IJ SOLN
INTRAMUSCULAR | Status: AC
  Filled 2017-09-14: qty 1

## 2017-09-14 MED ORDER — KETOROLAC TROMETHAMINE 30 MG/ML IJ SOLN: 30.0000 mg | Freq: Four times a day (QID) | INTRAMUSCULAR | Status: AC | PRN

## 2017-09-14 MED ORDER — SCOPOLAMINE 1 MG/3DAYS TD PT72
MEDICATED_PATCH | TRANSDERMAL | Status: AC
  Filled 2017-09-14: qty 1

## 2017-09-14 MED ORDER — FENTANYL CITRATE (PF) 100 MCG/2ML IJ SOLN
INTRAMUSCULAR | Status: AC
  Administered 2017-09-14: 100 ug
  Filled 2017-09-14: qty 2

## 2017-09-14 MED ORDER — SODIUM BICARBONATE 8.4 % IV SOLN
INTRAVENOUS | Status: DC | PRN
  Administered 2017-09-13 – 2017-09-14 (×3): 5 mL via EPIDURAL

## 2017-09-14 MED ORDER — PROMETHAZINE HCL 25 MG/ML IJ SOLN: 6.2500 mg | INTRAMUSCULAR | Status: DC | PRN

## 2017-09-14 MED ORDER — OXYCODONE-ACETAMINOPHEN 5-325 MG PO TABS
1.0000 | ORAL_TABLET | ORAL | Status: DC | PRN
  Administered 2017-09-15 – 2017-09-17 (×7): 1 via ORAL
  Filled 2017-09-14 (×7): qty 1

## 2017-09-14 MED ORDER — IBUPROFEN 600 MG PO TABS
600.0000 mg | ORAL_TABLET | Freq: Four times a day (QID) | ORAL | Status: DC
Start: 2017-09-14 — End: 2017-09-17
  Administered 2017-09-14 – 2017-09-17 (×14): 600 mg via ORAL
  Filled 2017-09-14 (×15): qty 1

## 2017-09-14 MED ORDER — SCOPOLAMINE 1 MG/3DAYS TD PT72
MEDICATED_PATCH | TRANSDERMAL | Status: DC | PRN
  Administered 2017-09-14: 1 via TRANSDERMAL

## 2017-09-14 MED ORDER — LACTATED RINGERS IV SOLN
INTRAVENOUS | Status: DC
  Administered 2017-09-14: 1 mL via INTRAVENOUS

## 2017-09-14 MED ORDER — SODIUM CHLORIDE 0.9% FLUSH: 3.0000 mL | INTRAVENOUS | Status: DC | PRN

## 2017-09-14 MED ORDER — PHENYLEPHRINE HCL 10 MG/ML IJ SOLN
INTRAMUSCULAR | Status: DC | PRN
  Administered 2017-09-14 (×4): 80 ug via INTRAVENOUS

## 2017-09-14 MED ORDER — MEPERIDINE HCL 25 MG/ML IJ SOLN
6.2500 mg | INTRAMUSCULAR | Status: DC | PRN
  Administered 2017-09-14: 6.25 mg via INTRAVENOUS

## 2017-09-14 MED ORDER — WITCH HAZEL-GLYCERIN EX PADS: 1.0000 "application " | MEDICATED_PAD | CUTANEOUS | Status: DC | PRN

## 2017-09-14 MED ORDER — MORPHINE SULFATE (PF) 0.5 MG/ML IJ SOLN
INTRAMUSCULAR | Status: AC
  Filled 2017-09-14: qty 10

## 2017-09-14 MED ORDER — ONDANSETRON HCL 4 MG/2ML IJ SOLN
INTRAMUSCULAR | Status: AC
  Filled 2017-09-14: qty 2

## 2017-09-14 MED ORDER — FENTANYL CITRATE (PF) 100 MCG/2ML IJ SOLN
INTRAMUSCULAR | Status: DC | PRN
  Administered 2017-09-14: 50 ug via INTRAVENOUS

## 2017-09-14 MED ORDER — MORPHINE SULFATE (PF) 0.5 MG/ML IJ SOLN
INTRAMUSCULAR | Status: DC | PRN
  Administered 2017-09-14: 3.5 mg via EPIDURAL

## 2017-09-14 MED ORDER — DIBUCAINE 1 % RE OINT: 1.0000 "application " | TOPICAL_OINTMENT | RECTAL | Status: DC | PRN

## 2017-09-14 MED ORDER — OXYTOCIN 10 UNIT/ML IJ SOLN
INTRAVENOUS | Status: DC | PRN
  Administered 2017-09-14: 40 [IU] via INTRAVENOUS

## 2017-09-14 MED ORDER — ONDANSETRON HCL 4 MG/2ML IJ SOLN: 4.0000 mg | Freq: Three times a day (TID) | INTRAMUSCULAR | Status: DC | PRN

## 2017-09-14 MED ORDER — MEPERIDINE HCL 25 MG/ML IJ SOLN
INTRAMUSCULAR | Status: AC
  Filled 2017-09-14: qty 1

## 2017-09-14 MED ORDER — METHYLERGONOVINE MALEATE 0.2 MG/ML IJ SOLN
INTRAMUSCULAR | Status: AC
  Administered 2017-09-14: 0.2 mg via INTRAMUSCULAR
  Filled 2017-09-14: qty 1

## 2017-09-14 MED ORDER — NALOXONE HCL 0.4 MG/ML IJ SOLN: 0.4000 mg | INTRAMUSCULAR | Status: DC | PRN

## 2017-09-14 MED ORDER — DEXAMETHASONE SODIUM PHOSPHATE 4 MG/ML IJ SOLN
INTRAMUSCULAR | Status: AC
  Filled 2017-09-14: qty 1

## 2017-09-14 MED ORDER — PRENATAL MULTIVITAMIN CH
1.0000 | ORAL_TABLET | Freq: Every day | ORAL | Status: DC
  Administered 2017-09-14 – 2017-09-17 (×4): 1 via ORAL
  Filled 2017-09-14 (×4): qty 1

## 2017-09-14 MED ORDER — MISOPROSTOL 200 MCG PO TABS
1000.0000 ug | ORAL_TABLET | Freq: Once | ORAL | Status: AC
  Administered 2017-09-14: 1000 ug via RECTAL

## 2017-09-14 MED ORDER — LACTATED RINGERS IV SOLN
INTRAVENOUS | Status: DC | PRN
  Administered 2017-09-14: via INTRAVENOUS

## 2017-09-14 MED ORDER — SODIUM CHLORIDE 0.9 % IR SOLN
Status: DC | PRN
  Administered 2017-09-14: 1

## 2017-09-14 MED ORDER — ZOLPIDEM TARTRATE 5 MG PO TABS: 5.0000 mg | ORAL_TABLET | Freq: Every evening | ORAL | Status: DC | PRN

## 2017-09-14 MED ORDER — PHENYLEPHRINE 40 MCG/ML (10ML) SYRINGE FOR IV PUSH (FOR BLOOD PRESSURE SUPPORT)
PREFILLED_SYRINGE | INTRAVENOUS | Status: AC
  Filled 2017-09-14: qty 10

## 2017-09-14 MED ORDER — ACETAMINOPHEN 500 MG PO TABS
1000.0000 mg | ORAL_TABLET | Freq: Four times a day (QID) | ORAL | Status: AC
  Administered 2017-09-14 (×3): 1000 mg via ORAL
  Filled 2017-09-14 (×3): qty 2

## 2017-09-14 MED ORDER — ONDANSETRON HCL 4 MG/2ML IJ SOLN
INTRAMUSCULAR | Status: DC | PRN
  Administered 2017-09-14: 4 mg via INTRAVENOUS

## 2017-09-14 MED ORDER — OXYCODONE-ACETAMINOPHEN 5-325 MG PO TABS
2.0000 | ORAL_TABLET | ORAL | Status: DC | PRN
  Administered 2017-09-16: 2 via ORAL
  Filled 2017-09-14: qty 2

## 2017-09-14 MED ORDER — SENNOSIDES-DOCUSATE SODIUM 8.6-50 MG PO TABS
2.0000 | ORAL_TABLET | ORAL | Status: DC
  Administered 2017-09-15 – 2017-09-17 (×3): 2 via ORAL
  Filled 2017-09-14 (×3): qty 2

## 2017-09-14 MED ORDER — ACETAMINOPHEN 325 MG PO TABS: 650.0000 mg | ORAL_TABLET | ORAL | Status: DC | PRN

## 2017-09-14 MED ORDER — TRANEXAMIC ACID 1000 MG/10ML IV SOLN
1000.0000 mg | INTRAVENOUS | Status: AC
  Administered 2017-09-14: 1000 mg via INTRAVENOUS
  Filled 2017-09-14: qty 10

## 2017-09-14 SURGICAL SUPPLY — 30 items
BENZOIN TINCTURE PRP APPL 2/3 (GAUZE/BANDAGES/DRESSINGS) ×3 IMPLANT
CHLORAPREP W/TINT 26ML (MISCELLANEOUS) ×3 IMPLANT
CLAMP CORD UMBIL (MISCELLANEOUS) IMPLANT
CLOSURE STERI-STRIP 1/2X4 (GAUZE/BANDAGES/DRESSINGS) ×1
CLOTH BEACON ORANGE TIMEOUT ST (SAFETY) ×3 IMPLANT
CLSR STERI-STRIP ANTIMIC 1/2X4 (GAUZE/BANDAGES/DRESSINGS) ×2 IMPLANT
DRSG OPSITE POSTOP 4X10 (GAUZE/BANDAGES/DRESSINGS) ×3 IMPLANT
ELECT REM PT RETURN 9FT ADLT (ELECTROSURGICAL) ×3
ELECTRODE REM PT RTRN 9FT ADLT (ELECTROSURGICAL) ×1 IMPLANT
EXTRACTOR VACUUM M CUP 4 TUBE (SUCTIONS) IMPLANT
EXTRACTOR VACUUM M CUP 4' TUBE (SUCTIONS)
GLOVE BIOGEL PI IND STRL 7.0 (GLOVE) ×1 IMPLANT
GLOVE BIOGEL PI INDICATOR 7.0 (GLOVE) ×2
GLOVE ECLIPSE 7.0 STRL STRAW (GLOVE) ×6 IMPLANT
GOWN STRL REUS W/TWL LRG LVL3 (GOWN DISPOSABLE) ×6 IMPLANT
KIT ABG SYR 3ML LUER SLIP (SYRINGE) IMPLANT
NEEDLE HYPO 25X5/8 SAFETYGLIDE (NEEDLE) IMPLANT
NS IRRIG 1000ML POUR BTL (IV SOLUTION) ×3 IMPLANT
PACK C SECTION WH (CUSTOM PROCEDURE TRAY) ×3 IMPLANT
PAD OB MATERNITY 4.3X12.25 (PERSONAL CARE ITEMS) ×3 IMPLANT
RTRCTR C-SECT PINK 25CM LRG (MISCELLANEOUS) ×3 IMPLANT
SPONGE LAP 18X18 RF (DISPOSABLE) ×3 IMPLANT
SUT MNCRL 0 VIOLET CTX 36 (SUTURE) ×3 IMPLANT
SUT MON AB 2-0 CT1 27 (SUTURE) ×6 IMPLANT
SUT MONOCRYL 0 CTX 36 (SUTURE) ×6
SUT PLAIN 0 NONE (SUTURE) IMPLANT
SUT PLAIN 2 0 XLH (SUTURE) ×3 IMPLANT
SUT VIC AB 4-0 KS 27 (SUTURE) ×3 IMPLANT
TOWEL OR 17X24 6PK STRL BLUE (TOWEL DISPOSABLE) ×3 IMPLANT
TRAY FOLEY W/BAG SLVR 14FR LF (SET/KITS/TRAYS/PACK) IMPLANT

## 2017-09-14 NOTE — Op Note (Signed)
NAMEGLENICE, CICCONE MEDICAL RECORD [id number] ACCOUNT [id number] DATE OF BIRTH:[birth year] FACILITY: [facility] LOCATION: [id number] Wynetta Emery, MD  OPERATIVE REPORT  DATE OF PROCEDURE:  09/14/2017  PREOPERATIVE DIAGNOSES:   1.  Intrauterine pregnancy at term. 2.  Fetal intolerance to labor.  POSTOPERATIVE DIAGNOSES:   1.  Intrauterine pregnancy at term. 2.  Fetal intolerance to labor with nuchal cord x1.  SURGEON:  Freda Munro, MD  ANESTHESIA:  Epidural.  ANTIBIOTICS:  Ancef 2 grams.  DRAINS:  Foley, bedside drainage.  ESTIMATED BLOOD LOSS:  900 mL.  COMPLICATIONS:  None.  FINDINGS:  Nuchal cord x1.  Placenta appeared to be normal.  There was a subserosal 7 cm fibroid on the patient's posterior left fundus.  Ovaries appeared normal.  DESCRIPTION OF PROCEDURE:  The patient was taken to the operating room and she was placed in the dorsal supine position with left lateral tilt.  Once an adequate anesthesia level was reached, she was prepped and draped in the usual fashion for this  procedure.  A Foley catheter had previously been placed.  A Pfannenstiel incision was then made 2 cm above the pubic symphysis.  On entering the abdominal cavity, the bladder flap was taken down with sharp dissection.  A low transverse uterine incision  was made in the midline with the Metzenbaum scissors and extended laterally with blunt dissection.  The infant was delivered in the vertex presentation.  On delivery of the head, the nuchal cord was reduced and the oropharynx and nostrils were bulb  suctioned.  The remaining infant was then delivered.  The cord doubly clamped and cut and the infant handed to the waiting NICU team.  Cord blood was obtained.  The placenta was manually removed.  The uterus was wiped with a wet lap.  The uterine  incision was closed in a single layer of 0 Monocryl suture in a running locking fashion.  Bladder flap was now repaired.  Hemostasis was checked and felt  to be adequate.  The parietal peritoneum and rectus muscles were approximated in the midline using  2-0 Monocryl in a running fashion.  The fascia was closed using 0 Monocryl suture in a running fashion.  Subcuticular tissue was made hemostatic with the Bovie and then closed with interrupted 2-0 plain gut sutures.  A 4-0 Vicryl suture was used to close  the skin in a subcuticular fashion.  Steri-Strips were then placed.  The patient was taken to the recovery room in stable condition.  Instrument and lap count was correct x3.  TN/NUANCE  D:09/14/2017 T:09/14/2017 JOB:[id number]

## 2017-09-14 NOTE — Lactation Note (Signed)
This note was copied from a baby's chart. Lactation Consultation Note  Patient Name: Boy Reene Harlacher BTDHR'C Date: 09/14/2017 Reason for consult: Initial assessment   P4, Baby 67 hours old and sleeping. Ex BF 4-8 mos. Room full of visitors. Mother states baby recently breastfed for 10 min and fell asleep. Mother states baby is latching well. Denies questions or concerns. Mom encouraged to feed baby 8-12 times/24 hours and with feeding cues.  Mom made aware of O/P services, breastfeeding support groups, community resources, and our phone # for post-discharge questions.      Maternal Data Has patient been taught Hand Expression?: Yes Does the patient have breastfeeding experience prior to this delivery?: Yes  Feeding Feeding Type: Breast Fed Length of feed: 10 min  LATCH Score                   Interventions    Lactation Tools Discussed/Used     Consult Status Consult Status: Follow-up Date: 09/15/17 Follow-up type: In-patient    Vivianne Master [facility] 09/14/2017, 11:37 AM

## 2017-09-14 NOTE — Progress Notes (Signed)
Patient is doing well.  Pain is controlled and she is tolerating PO. Foley catheter is still in place and she has not yet ambulated.  Lochia is appropriate  Vitals:   09/14/17 0250 09/14/17 0358 09/14/17 0445 09/14/17 0540  BP: 117/68 112/64 118/66 (!) 116/53  Pulse: 91 86 79 83  Resp: 16 17 16 18   Temp: 100 F (37.8 C) 99.7 F (37.6 C) 99.2 F (37.3 C) 98.5 F (36.9 C)  TempSrc: Oral Oral Oral Oral  SpO2: 100% 95% 95% 95%  Weight:      Height:        NAD Abd: Fundus firm, dressing c/d/i w mild heme Ext: +SCDs  Lab Results  Component Value Date   WBC 16.0 (H) 09/14/2017   HGB 9.0 (L) 09/14/2017   HCT 26.7 (L) 09/14/2017   MCV 83.4 09/14/2017   PLT 161 09/14/2017    --/--/O POS, O POS Performed at [facility], [address] [place], [place] [address]  (06/09 1340)/[employer]  A/P 27 y.o. [id number] POD#0 s/p primary CD for NRFS. Routine care.   Foley out today and out of bed. Circ as outpatient    [place]

## 2017-09-14 NOTE — Progress Notes (Signed)
Transport to OR

## 2017-09-14 NOTE — Anesthesia Postprocedure Evaluation (Signed)
Anesthesia Post Note  Patient: Kimberly Morris  Procedure(s) Performed: CESAREAN SECTION (N/A )     Patient location during evaluation: Mother Baby Anesthesia Type: Epidural Level of consciousness: awake and alert Pain management: pain level controlled Vital Signs Assessment: post-procedure vital signs reviewed and stable Respiratory status: spontaneous breathing, nonlabored ventilation and respiratory function stable Cardiovascular status: stable Postop Assessment: no headache, no backache, epidural receding, no apparent nausea or vomiting, patient able to bend at knees, able to ambulate and adequate PO intake Anesthetic complications: no    Last Vitals:  Vitals:   09/14/17 0445 09/14/17 0540  BP: 118/66 (!) 116/53  Pulse: 79 83  Resp: 16 18  Temp: 37.3 C 36.9 C  SpO2: 95% 95%    Last Pain:  Vitals:   09/14/17 0540  TempSrc: Oral  PainSc: 0-No pain   Pain Goal:                 [employer]

## 2017-09-14 NOTE — Transfer of Care (Signed)
Immediate Anesthesia Transfer of Care Note  Patient: Kimberly Morris  Procedure(s) Performed: CESAREAN SECTION (N/A )  Patient Location: PACU  Anesthesia Type:Epidural  Level of Consciousness: awake, alert , oriented and patient cooperative  Airway & Oxygen Therapy: Patient Spontanous Breathing  Post-op Assessment: Report given to RN and Post -op Vital signs reviewed and stable  Post vital signs: Reviewed and stable  Last Vitals:  Vitals Value Taken Time  BP 110/65 09/14/2017  1:00 AM  Temp    Pulse 104 09/14/2017  1:07 AM  Resp 17 09/14/2017  1:07 AM  SpO2 99 % 09/14/2017  1:07 AM  Vitals shown include unvalidated device data.  Last Pain:  Vitals:   09/13/17 2130  TempSrc: Oral  PainSc:          Complications: No apparent anesthesia complications

## 2017-09-15 ENCOUNTER — Encounter (HOSPITAL_COMMUNITY): Payer: Self-pay | Admitting: Obstetrics and Gynecology

## 2017-09-15 LAB — BIRTH TISSUE RECOVERY COLLECTION (PLACENTA DONATION)

## 2017-09-15 MED ORDER — FERROUS SULFATE 325 (65 FE) MG PO TABS
325.0000 mg | ORAL_TABLET | Freq: Every day | ORAL | Status: DC
  Administered 2017-09-15 – 2017-09-17 (×3): 325 mg via ORAL
  Filled 2017-09-15 (×3): qty 1

## 2017-09-15 NOTE — Progress Notes (Signed)
CSW received consult for hx of marijuana use.  Referral was screened out due to the following: ~MOB had no positive drug screens after initial prenatal visit/+UPT. ~Baby's UDS is negative.  Please consult CSW if current concerns arise or by MOB's request.  CSW will monitor CDS results and make report to Child Protective Services if warranted.  Laurey Arrow, MSW, LCSW Clinical Social Work [phone]

## 2017-09-15 NOTE — Progress Notes (Signed)
  Patient is eating, ambulating, voiding.  Pain control is good.  Vitals:   09/14/17 1443 09/14/17 2005 09/15/17 0052 09/15/17 0500  BP: (!) 101/45 107/69 (!) 103/57 97/64  Pulse: 71 81 76 86  Resp:  18 18 18   Temp: 98.3 F (36.8 C) 98.2 F (36.8 C) 98.4 F (36.9 C) 98.5 F (36.9 C)  TempSrc:  Oral Oral Oral  SpO2: 98%     Weight:      Height:        lungs:   clear to auscultation cor:    RRR Abdomen:  soft, appropriate tenderness, incisions intact and without erythema or exudate ex:    no cords   Lab Results  Component Value Date   WBC 16.0 (H) 09/14/2017   HGB 9.0 (L) 09/14/2017   HCT 26.7 (L) 09/14/2017   MCV 83.4 09/14/2017   PLT 161 09/14/2017    --/--/O POS, O POS Performed at [facility], [address], [place], [place] [address]  (06/09 1340)/RI  A/P    Post operative day 1.  Routine post op and postpartum care.  Expect d/c tomorrow.  Percocet for pain control. Iron.

## 2017-09-15 NOTE — Lactation Note (Signed)
This note was copied from a baby's chart. Lactation Consultation Note  Patient Name: Kimberly Morris ASNKN'L Date: 09/15/2017 Reason for consult: Follow-up assessment;Term   Follow up with mom of 22 hour old infant. Infant with 9 BF for 20-60 minutes, formula x 2 of 17-45 cc, 1 void and 2 stools in the last 24 hours. Infant weight 7 pounds 0.5 ounces with weight loss of 5% since birth. LATCH scores 8.   Mom reports she has been BF infant. She has attempted to pump using manual pump and hand express with no colostrum see. Mom is offering formula after BF. Enc mom to feed infant at the breast with feeding cues for as long as infant wants before giving formula. Enc mom to feed STS and to stimulate infant during feeding as well as breast massage with feeding. Mom voiced understanding.   Mom reports her breasts are not feeling any fuller today. Mom denies pain with breast feedings. Reviewed supply and demand and milk coming to volume with mom.   Mom asked for bottle of formula, gave her a bottle at her request. GM asked for face mask as she says she "is a little under the weather". Face mask given.   Mom reports she has no questions/concerns at this time. Mom to call out for assistance as needed.      Maternal Data Formula Feeding for Exclusion: Yes Reason for exclusion: Mother's choice to formula and breast feed on admission Has patient been taught Hand Expression?: Yes Does the patient have breastfeeding experience prior to this delivery?: Yes  Feeding Feeding Type: Formula Nipple Type: Slow - flow  LATCH Score                   Interventions Interventions: Skin to skin;Breast compression;Breast massage;Hand express  Lactation Tools Discussed/Used     Consult Status Consult Status: Follow-up Date: 09/16/17 Follow-up type: In-patient    Debby Freiberg Annelise Mccoy 09/15/2017, 3:10 PM

## 2017-09-15 NOTE — Anesthesia Postprocedure Evaluation (Signed)
Anesthesia Post Note  Patient: Kimberly Morris  Procedure(s) Performed: CESAREAN SECTION (N/A )     Patient location during evaluation: PACU Anesthesia Type: Epidural Level of consciousness: awake and alert Pain management: pain level controlled Vital Signs Assessment: post-procedure vital signs reviewed and stable Respiratory status: spontaneous breathing, nonlabored ventilation and respiratory function stable Cardiovascular status: stable Postop Assessment: no headache, no backache, epidural receding, patient able to bend at knees and no apparent nausea or vomiting Anesthetic complications: no    Last Vitals:  Vitals:   09/15/17 0052 09/15/17 0500  BP: (!) 103/57 97/64  Pulse: 76 86  Resp: 18 18  Temp: 36.9 C 36.9 C  SpO2:      Last Pain:  Vitals:   09/15/17 0500  TempSrc: Oral  PainSc: 0-No pain   Pain Goal:                 Catalina Gravel

## 2017-09-16 MED ORDER — OXYCODONE-ACETAMINOPHEN 5-325 MG PO TABS: 1.0000 | ORAL_TABLET | ORAL | 0 refills | Status: DC | PRN

## 2017-09-16 NOTE — Discharge Summary (Signed)
Obstetric Discharge Summary Reason for Admission: induction of labor and after decreased fetal movment and late decels on monitoring Prenatal Procedures: ultrasound Intrapartum Procedures: cesarean: low cervical, transverse Postpartum Procedures: none Complications-Operative and Postpartum: none Hemoglobin  Date Value Ref Range Status  09/14/2017 9.0 (L) 12.0 - 15.0 g/dL Final   HCT  Date Value Ref Range Status  09/14/2017 26.7 (L) 36.0 - 46.0 % Final    Physical Exam:  General: alert and cooperative Lochia: appropriate Uterine Fundus: firm Incision: healing well, no significant drainage DVT Evaluation: No evidence of DVT seen on physical exam.  Discharge Diagnoses: Term Pregnancy-delivered  Discharge Information: Date: 09/16/2017 Activity: pelvic rest Diet: routine Medications: PNV, Ibuprofen and Percocet Condition: stable Instructions: refer to practice specific booklet Discharge to: home Follow-up Information    Kimberly Millers, MD Follow up in 2 week(s).   Specialty:  Obstetrics and Gynecology Contact information: [place] [id number] [phone]           Newborn Data: Live born female  Birth Weight: 7 lb 6 oz (3345 g) APGAR: 21, 8  Newborn Delivery   Birth date/time:  09/14/2017 00:16:00 Delivery type:  C-Section, Low Transverse Trial of labor:  Yes C-section categorization:  Primary     Home with mother.  Kimberly Morris 09/16/2017, 9:59 AM

## 2017-09-16 NOTE — Lactation Note (Signed)
This note was copied from a baby's chart. Lactation Consultation Note  Patient Name: Boy Johara Lodwick QASTM'H Date: 09/16/2017 Reason for consult: Follow-up assessment   Follow up with mom of 51 hour old infant. Infant with 7 BF for 30-40 minutes, formula x 6 via bottle of 40-60 ml, 3 voids and 2 stools in the last 24 hours. Infant weight 7 pounds 3 ounces with 3% weight loss since birth. LATCH score 9.   Mom reports BF is going well. She reports sometimes infant is sleepy at the breast, mom massages breasts and stimulates infant as needed. Mom reports she feels very full, she denies s/s engorgement. Mom denies nipple pain with feedings.   Enc mom to offer breast prior to offering bottle, mom voiced understanding. Mom reports she has not questions/concerns at this time.    Maternal Data Formula Feeding for Exclusion: Yes Reason for exclusion: Mother's choice to formula and breast feed on admission Has patient been taught Hand Expression?: Yes Does the patient have breastfeeding experience prior to this delivery?: Yes  Feeding Feeding Type: Bottle Fed - Formula  LATCH Score                   Interventions    Lactation Tools Discussed/Used     Consult Status Consult Status: Follow-up Date: 09/17/17 Follow-up type: In-patient    Debby Freiberg Gauri Galvao 09/16/2017, 4:49 PM

## 2017-10-14 DIAGNOSIS — N760 Acute vaginitis: Secondary | ICD-10-CM | POA: Diagnosis not present

## 2018-08-19 ENCOUNTER — Other Ambulatory Visit: Payer: Self-pay

## 2018-08-19 ENCOUNTER — Encounter (HOSPITAL_COMMUNITY): Payer: Self-pay | Admitting: *Deleted

## 2018-08-19 ENCOUNTER — Inpatient Hospital Stay (HOSPITAL_COMMUNITY)
Admission: AD | Admit: 2018-08-19 | Discharge: 2018-08-19 | Disposition: A | Discharge status: Home / Self Care | Payer: Medicaid Other | Attending: Obstetrics & Gynecology | Admitting: Obstetrics & Gynecology

## 2018-08-19 ENCOUNTER — Inpatient Hospital Stay (HOSPITAL_COMMUNITY): Payer: Medicaid Other

## 2018-08-19 DIAGNOSIS — M545 Low back pain: Secondary | ICD-10-CM | POA: Diagnosis not present

## 2018-08-19 DIAGNOSIS — O4691 Antepartum hemorrhage, unspecified, first trimester: Secondary | ICD-10-CM | POA: Diagnosis not present

## 2018-08-19 DIAGNOSIS — O208 Other hemorrhage in early pregnancy: Secondary | ICD-10-CM | POA: Diagnosis not present

## 2018-08-19 DIAGNOSIS — N939 Abnormal uterine and vaginal bleeding, unspecified: Secondary | ICD-10-CM

## 2018-08-19 DIAGNOSIS — Z3490 Encounter for supervision of normal pregnancy, unspecified, unspecified trimester: Secondary | ICD-10-CM

## 2018-08-19 DIAGNOSIS — O26891 Other specified pregnancy related conditions, first trimester: Secondary | ICD-10-CM | POA: Diagnosis not present

## 2018-08-19 DIAGNOSIS — O26859 Spotting complicating pregnancy, unspecified trimester: Secondary | ICD-10-CM | POA: Diagnosis not present

## 2018-08-19 DIAGNOSIS — R109 Unspecified abdominal pain: Secondary | ICD-10-CM | POA: Diagnosis not present

## 2018-08-19 DIAGNOSIS — Z3A10 10 weeks gestation of pregnancy: Secondary | ICD-10-CM | POA: Insufficient documentation

## 2018-08-19 HISTORY — DX: Encounter for other specified aftercare: Z51.89

## 2018-08-19 LAB — CBC WITH DIFFERENTIAL/PLATELET
Abs Immature Granulocytes: 0.02 10*3/uL (ref 0.00–0.07)
Basophils Absolute: 0 10*3/uL (ref 0.0–0.1)
Basophils Relative: 1 %
Eosinophils Absolute: 0.2 10*3/uL (ref 0.0–0.5)
Eosinophils Relative: 2 %
HCT: 34 % — ABNORMAL LOW (ref 36.0–46.0)
Hemoglobin: 11.2 g/dL — ABNORMAL LOW (ref 12.0–15.0)
Immature Granulocytes: 0 %
Lymphocytes Relative: 19 %
Lymphs Abs: 1.5 10*3/uL (ref 0.7–4.0)
MCH: 27.7 pg (ref 26.0–34.0)
MCHC: 32.9 g/dL (ref 30.0–36.0)
MCV: 84.2 fL (ref 80.0–100.0)
Monocytes Absolute: 0.8 10*3/uL (ref 0.1–1.0)
Monocytes Relative: 11 %
Neutro Abs: 5.3 10*3/uL (ref 1.7–7.7)
Neutrophils Relative %: 67 %
Platelets: 212 10*3/uL (ref 150–400)
RBC: 4.04 MIL/uL (ref 3.87–5.11)
RDW: 15.5 % (ref 11.5–15.5)
WBC: 7.9 10*3/uL (ref 4.0–10.5)
nRBC: 0 % (ref 0.0–0.2)

## 2018-08-19 LAB — HCG, QUANTITATIVE, PREGNANCY: hCG, Beta Chain, Quant, S: 4676 m[IU]/mL — ABNORMAL HIGH (ref <5)

## 2018-08-19 LAB — POC URINE PREG, ED: Preg Test, Ur: POSITIVE — AB

## 2018-08-19 MED ORDER — OXYCODONE-ACETAMINOPHEN 5-325 MG PO TABS: 1.0000 | ORAL_TABLET | ORAL | 0 refills | Status: DC | PRN

## 2018-08-19 MED ORDER — OXYCODONE-ACETAMINOPHEN 5-325 MG PO TABS
2.0000 | ORAL_TABLET | Freq: Once | ORAL | Status: AC
  Administered 2018-08-19: 19:00:00 2 via ORAL
  Filled 2018-08-19: qty 2

## 2018-08-19 NOTE — Progress Notes (Signed)
WRitten and verbal d/c instructions given and understanding voiced.  

## 2018-08-19 NOTE — Discharge Instructions (Signed)
Vaginal Bleeding During Pregnancy, First Trimester    A small amount of bleeding (spotting) from the vagina is common during early pregnancy. Sometimes the bleeding is normal and does not cause problems. At other times, though, bleeding may be a sign of something serious. Tell your doctor about any bleeding from your vagina right away.  Follow these instructions at home:  Activity  · Follow your doctor's instructions about how active you can be.  · If needed, make plans for someone to help with your normal activities.  · Do not have sex or orgasms until your doctor says that this is safe.  General instructions  · Take over-the-counter and prescription medicines only as told by your doctor.  · Watch your condition for any changes.  · Write down:  ? The number of pads you use each day.  ? How often you change pads.  ? How soaked (saturated) your pads are.  · Do not use tampons.  · Do not douche.  · If you pass any tissue from your vagina, save it to show to your doctor.  · Keep all follow-up visits as told by your doctor. This is important.  Contact a doctor if:  · You have vaginal bleeding at any time while you are pregnant.  · You have cramps.  · You have a fever.  Get help right away if:  · You have very bad cramps in your back or belly (abdomen).  · You pass large clots or a lot of tissue from your vagina.  · Your bleeding gets worse.  · You feel light-headed.  · You feel weak.  · You pass out (faint).  · You have chills.  · You are leaking fluid from your vagina.  · You have a gush of fluid from your vagina.  Summary  · Sometimes vaginal bleeding during pregnancy is normal and does not cause problems. At other times, bleeding may be a sign of something serious.  · Tell your doctor about any bleeding from your vagina right away.  · Follow your doctor's instructions about how active you can be. You may need someone to help you with your normal activities.  This information is not intended to replace advice given to  you by your health care provider. Make sure you discuss any questions you have with your health care provider.  Document Released: 08/08/2013 Document Revised: 06/25/2016 Document Reviewed: 06/25/2016  Elsevier Interactive Patient Education © 2019 Elsevier Inc.

## 2018-08-19 NOTE — MAU Provider Note (Signed)
History     CSN: [id number]  Arrival date and time: 08/19/18 1718   None     Chief Complaint  Patient presents with  . Abdominal Pain  . Vaginal Bleeding   HPI Kimberly Morris is a 28 y.o. [id number] at [redacted]w[redacted]d by certain LMP who presents to MAU with chief complaint of heavy vaginal bleeding and severe abdominal pain.   Vaginal Bleeding This is a new problem, onset last night with scant amount of pink-tinged vaginal discharge.Today around 11am the amount and character of her bleeding changed to heavy and brownish-red. Patient reports saturating multiple pads since that time and has seen multiple small clots. She denies dizziness, weakness, syncope.  Abdominal Pain This is a new problem, onset last night.Patient reports sharp "stabbing pain" which waxes and wanes. She rates her pain as 10/10 and reports that it radiates to her lower back. She is coping with focused breathing. She has not taken medication or tried other treatments for this pain.  Patient lives with her boyfriend and her children. She denies SI, HI and concern for IPV.  OB History    Gravida  7   Para  4   Term  4   Preterm  0   AB  2   Living  4     SAB  1   TAB  1   Ectopic      Multiple  0   Live Births  4           Past Medical History:  Diagnosis Date  . Anemia    previous pregnancy     Past Surgical History:  Procedure Laterality Date  . CESAREAN SECTION N/A 09/14/2017   Procedure: CESAREAN SECTION;  Surgeon: Olga Millers, MD;  Location: [facility];  Service: Obstetrics;  Laterality: N/A;  . NO PAST SURGERIES      Family History  Problem Relation Age of Onset  . ADD / ADHD Neg Hx   . Alcohol abuse Neg Hx   . Anxiety disorder Neg Hx   . Arthritis Neg Hx   . Asthma Neg Hx   . Birth defects Neg Hx   . Cancer Neg Hx   . COPD Neg Hx   . Depression Neg Hx   . Diabetes Neg Hx   . Drug abuse Neg Hx   . Early death Neg Hx   . Hearing loss Neg Hx   . Heart disease Neg Hx    . Hyperlipidemia Neg Hx   . Intellectual disability Neg Hx   . Kidney disease Neg Hx   . Learning disabilities Neg Hx   . Obesity Neg Hx   . Miscarriages / Stillbirths Neg Hx   . Stroke Neg Hx   . Vision loss Neg Hx   . Varicose Veins Neg Hx   . Hypertension Neg Hx     Social History   Tobacco Use  . Smoking status: Never Smoker  . Smokeless tobacco: Never Used  Substance Use Topics  . Alcohol use: Not Currently  . Drug use: Not Currently    Types: Marijuana    Allergies: No Known Allergies  Medications Prior to Admission  Medication Sig Dispense Refill Last Dose  . oxyCODONE-acetaminophen (PERCOCET/ROXICET) 5-325 MG tablet Take 1 tablet by mouth every 4 (four) hours as needed (pain scale 4-7). 30 tablet 0     Review of Systems  Constitutional: Negative for chills, fatigue and fever.  Gastrointestinal: Positive for abdominal pain.  Genitourinary: Positive  for vaginal bleeding. Negative for dysuria and vaginal pain.  Musculoskeletal: Positive for back pain.  Neurological: Negative for dizziness, syncope, weakness and light-headedness.  All other systems reviewed and are negative.  Physical Exam   Temperature 98.6 F (37 C), temperature source Oral, last menstrual period 06/05/2018, unknown if currently breastfeeding.  Physical Exam  Nursing note and vitals reviewed. Constitutional: She is oriented to person, place, and time. She appears well-developed and well-nourished.  Cardiovascular: Normal rate.  Respiratory: Effort normal.  GI: Soft. She exhibits no distension. There is no abdominal tenderness. There is no rebound and no guarding.  Genitourinary:    Genitourinary Comments: Moderate amount of dark red vaginal bleeding visualized on spec exam. Bimanual exam not performed.   Neurological: She is alert and oriented to person, place, and time.  Skin: Skin is warm.  Psychiatric: She has a normal mood and affect. Her behavior is normal. Judgment and thought  content normal.    MAU Course/MDM  Procedures: sterile speculum exam  Patient Vitals for the past 24 hrs:  BP Temp Temp src Pulse Resp SpO2  08/19/18 1859 112/68 98.7 F (37.1 C) Oral 84 12 100 %  08/19/18 1804 - 98.6 F (37 C) Oral - - -    Results for orders placed or performed during the hospital encounter of 08/19/18 (from the past 24 hour(s))  POC Urine Pregnancy, ED (not at [facility])     Status: Abnormal   Collection Time: 08/19/18  5:43 PM  Result Value Ref Range   Preg Test, Ur POSITIVE (A) NEGATIVE  CBC with Differential/Platelet     Status: Abnormal   Collection Time: 08/19/18  7:10 PM  Result Value Ref Range   WBC 7.9 4.0 - 10.5 K/uL   RBC 4.04 3.87 - 5.11 MIL/uL   Hemoglobin 11.2 (L) 12.0 - 15.0 g/dL   HCT 34.0 (L) 36.0 - 46.0 %   MCV 84.2 80.0 - 100.0 fL   MCH 27.7 26.0 - 34.0 pg   MCHC 32.9 30.0 - 36.0 g/dL   RDW 15.5 11.5 - 15.5 %   Platelets 212 150 - 400 K/uL   nRBC 0.0 0.0 - 0.2 %   Neutrophils Relative % 67 %   Neutro Abs 5.3 1.7 - 7.7 K/uL   Lymphocytes Relative 19 %   Lymphs Abs 1.5 0.7 - 4.0 K/uL   Monocytes Relative 11 %   Monocytes Absolute 0.8 0.1 - 1.0 K/uL   Eosinophils Relative 2 %   Eosinophils Absolute 0.2 0.0 - 0.5 K/uL   Basophils Relative 1 %   Basophils Absolute 0.0 0.0 - 0.1 K/uL   Immature Granulocytes 0 %   Abs Immature Granulocytes 0.02 0.00 - 0.07 K/uL  hCG, quantitative, pregnancy     Status: Abnormal   Collection Time: 08/19/18  7:10 PM  Result Value Ref Range   hCG, Beta Chain, Quant, S 4,676 (H) <5 mIU/mL    US Ob Less Than 14 Weeks With Ob Transvaginal  Result Date: 08/19/2018 CLINICAL DATA:  28 year old pregnant female presents with vaginal bleeding and abdominal pain. Quantitative beta HCG pending. EDC by LMP: 03/12/2019, projecting to an expected gestational age of [redacted] weeks 5 days. EXAM: OBSTETRIC <14 WK [place] AND TRANSVAGINAL OB US TECHNIQUE: Both transabdominal and transvaginal ultrasound examinations were performed for  complete evaluation of the gestation as well as the maternal uterus, adnexal regions, and pelvic cul-de-sac. Transvaginal technique was performed to assess early pregnancy. COMPARISON:  None. FINDINGS: Intrauterine gestational sac: Single intrauterine  gestational sac with irregular contour. Yolk sac: Thin-walled 9 mm round cystic structure within the gestational sac may represent an abnormally large yolk sac. Embryo:  Tiny suspected embryo. Embryonic Cardiac Activity: Not Visualized. CRL:  2.5 mm   5 w   5 d                  [place] EDC: 04/12/2019 Subchorionic hemorrhage: Small perigestational bleed in the lower cavity involving less than 30% of the gestational sac circumference. Maternal uterus/adnexae: Retroverted uterus. Left posterior uterine body subserosal 4.4 x 3.8 x 6.6 cm fibroid. Left ovary measures 2.5 x 1.3 cm. Right ovary measures 3.8 x 2.3 x 2.8 cm. No abnormal ovarian or adnexal masses. No abnormal free fluid in the pelvis. IMPRESSION: 1. Single intrauterine gestation at 5 weeks 5 days by crown-rump length, discordant with expected gestational age of [redacted] weeks 5 days by provided menstrual dating. No embryonic cardiac activity detected. Suspected abnormally large yolk sac. Irregular gestational sac contour. Small perigestational bleed. These findings are poor prognostic factors. Findings are suspicious but not yet definitive for pregnancy demise. Recommend follow-up US in 11-14 days for definitive diagnosis. This recommendation follows SRU consensus guidelines: Diagnostic Criteria for Nonviable Pregnancy Early in the First Trimester. [employer] Med 2013; 621:3086-57. 2. Large 6.6 cm subserosal left posterior uterine body fibroid. 3. No ovarian or adnexal abnormality.  No free pelvic fluid. Electronically Signed   By: Ilona Sorrel M.D.   On: 08/19/2018 20:00   Meds ordered this encounter  Medications  . oxyCODONE-acetaminophen (PERCOCET/ROXICET) 5-325 MG per tablet 2 tablet  . oxyCODONE-acetaminophen  (PERCOCET/ROXICET) 5-325 MG tablet    Sig: Take 1 tablet by mouth every 4 (four) hours as needed for up to 5 doses for severe pain.    Dispense:  5 tablet    Refill:  0    For pain management in the event of miscarriage    Order Specific Question:   Supervising Provider    Answer:   Donnamae Jude [[id number]]    Assessment and Plan  --28 y.o. [id number] at with [facility] at [redacted]w[redacted]d by LMP --Early pregnancy vs suspicion for pregnancy failure --Patient denies pain at time of discharge. Given rx for very short course of Percocet in event of miscarriage --Discharge home in stable condition with bleeding precautions  F/U: Repeat US ordered for two weeks from today.   [place] 08/19/2018, 8:50 PM

## 2018-08-19 NOTE — MAU Note (Signed)
Pt states she feels like she's "being stabbed".  Pt states she began bleeding last pm seeing light pink spots, that stopped.  Today felt a sharp pain then a gush at 1100, noted brown blood.  States she is saturating a pad in an hour of red bleeding with clots that "are not that big".

## 2018-08-20 ENCOUNTER — Inpatient Hospital Stay (HOSPITAL_COMMUNITY)
Admission: AD | Admit: 2018-08-20 | Discharge: 2018-08-20 | Disposition: A | Discharge status: Home / Self Care | Payer: Medicaid Other | Attending: Obstetrics and Gynecology | Admitting: Obstetrics and Gynecology

## 2018-08-20 ENCOUNTER — Encounter (HOSPITAL_COMMUNITY): Payer: Self-pay | Admitting: *Deleted

## 2018-08-20 ENCOUNTER — Inpatient Hospital Stay (HOSPITAL_COMMUNITY): Payer: Medicaid Other

## 2018-08-20 DIAGNOSIS — R109 Unspecified abdominal pain: Secondary | ICD-10-CM | POA: Diagnosis not present

## 2018-08-20 DIAGNOSIS — O209 Hemorrhage in early pregnancy, unspecified: Secondary | ICD-10-CM | POA: Diagnosis not present

## 2018-08-20 DIAGNOSIS — O26891 Other specified pregnancy related conditions, first trimester: Secondary | ICD-10-CM | POA: Diagnosis not present

## 2018-08-20 DIAGNOSIS — Z6790 Unspecified blood type, Rh positive: Secondary | ICD-10-CM

## 2018-08-20 DIAGNOSIS — O4690 Antepartum hemorrhage, unspecified, unspecified trimester: Secondary | ICD-10-CM

## 2018-08-20 DIAGNOSIS — R58 Hemorrhage, not elsewhere classified: Secondary | ICD-10-CM | POA: Diagnosis not present

## 2018-08-20 DIAGNOSIS — R1084 Generalized abdominal pain: Secondary | ICD-10-CM | POA: Diagnosis not present

## 2018-08-20 DIAGNOSIS — R0689 Other abnormalities of breathing: Secondary | ICD-10-CM | POA: Diagnosis not present

## 2018-08-20 DIAGNOSIS — Z3A10 10 weeks gestation of pregnancy: Secondary | ICD-10-CM | POA: Insufficient documentation

## 2018-08-20 DIAGNOSIS — O039 Complete or unspecified spontaneous abortion without complication: Secondary | ICD-10-CM

## 2018-08-20 DIAGNOSIS — R52 Pain, unspecified: Secondary | ICD-10-CM | POA: Diagnosis not present

## 2018-08-20 DIAGNOSIS — O26859 Spotting complicating pregnancy, unspecified trimester: Secondary | ICD-10-CM | POA: Diagnosis not present

## 2018-08-20 DIAGNOSIS — O021 Missed abortion: Secondary | ICD-10-CM | POA: Diagnosis not present

## 2018-08-20 LAB — CBC
HCT: 34.9 % — ABNORMAL LOW (ref 36.0–46.0)
Hemoglobin: 11.6 g/dL — ABNORMAL LOW (ref 12.0–15.0)
MCH: 28.2 pg (ref 26.0–34.0)
MCHC: 33.2 g/dL (ref 30.0–36.0)
MCV: 84.7 fL (ref 80.0–100.0)
Platelets: 200 10*3/uL (ref 150–400)
RBC: 4.12 MIL/uL (ref 3.87–5.11)
RDW: 15.6 % — ABNORMAL HIGH (ref 11.5–15.5)
WBC: 11.7 10*3/uL — ABNORMAL HIGH (ref 4.0–10.5)
nRBC: 0 % (ref 0.0–0.2)

## 2018-08-20 LAB — HCG, QUANTITATIVE, PREGNANCY: hCG, Beta Chain, Quant, S: 2129 m[IU]/mL — ABNORMAL HIGH (ref <5)

## 2018-08-20 MED ORDER — MISOPROSTOL 200 MCG PO TABS
600.0000 ug | ORAL_TABLET | Freq: Once | ORAL | Status: AC
  Administered 2018-08-20: 600 ug via ORAL
  Filled 2018-08-20: qty 3

## 2018-08-20 MED ORDER — IBUPROFEN 600 MG PO TABS: 600.0000 mg | ORAL_TABLET | Freq: Four times a day (QID) | ORAL | 0 refills | Status: DC | PRN

## 2018-08-20 MED ORDER — HYDROMORPHONE HCL 1 MG/ML IJ SOLN
1.0000 mg | INTRAMUSCULAR | Status: DC | PRN
  Administered 2018-08-20: 1 mg via INTRAVENOUS
  Filled 2018-08-20: qty 1

## 2018-08-20 MED ORDER — LACTATED RINGERS IV BOLUS
1000.0000 mL | Freq: Once | INTRAVENOUS | Status: AC
  Administered 2018-08-20: 19:00:00 1000 mL via INTRAVENOUS

## 2018-08-20 NOTE — Discharge Instructions (Signed)
Miscarriage  A miscarriage is the loss of an unborn baby (fetus) before the 20th week of pregnancy.  Follow these instructions at home:  Medicines    · Take over-the-counter and prescription medicines only as told by your doctor.  · If you were prescribed antibiotic medicine, take it as told by your doctor. Do not stop taking the antibiotic even if you start to feel better.  · Do not take NSAIDs unless your doctor says that this is safe for you. NSAIDs include aspirin and ibuprofen. These medicines can cause bleeding.  Activity  · Rest as directed. Ask your doctor what activities are safe for you.  · Have someone help you at home during this time.  General instructions  · Write down how many pads you use each day and how soaked they are.  · Watch the amount of tissue or clumps of blood (blood clots) that you pass from your vagina. Save any large amounts of tissue for your doctor.  · Do not use tampons, douche, or have sex until your doctor approves.  · To help you and your partner with the process of grieving, talk with your doctor or seek counseling.  · When you are ready, meet with your doctor to talk about steps you should take for your health. Also, talk with your doctor about steps to take to have a healthy pregnancy in the future.  · Keep all follow-up visits as told by your doctor. This is important.  Contact a doctor if:  · You have a fever or chills.  · You have vaginal discharge that smells bad.  · You have more bleeding.  Get help right away if:  · You have very bad cramps or pain in your back or belly.  · You pass clumps of blood that are walnut-sized or larger from your vagina.  · You pass tissue that is walnut-sized or larger from your vagina.  · You soak more than 1 regular pad in an hour.  · You get light-headed or weak.  · You faint (pass out).  · You have feelings of sadness that do not go away, or you have thoughts of hurting yourself.  Summary  · A miscarriage is the loss of an unborn baby before  the 20th week of pregnancy.  · Follow your doctor's instructions for home care. Keep all follow-up appointments.  · To help you and your partner with the process of grieving, talk with your doctor or seek counseling.  This information is not intended to replace advice given to you by your health care provider. Make sure you discuss any questions you have with your health care provider.  Document Released: 06/16/2011 Document Revised: 04/29/2016 Document Reviewed: 04/29/2016  Elsevier Interactive Patient Education © 2019 [employer] Inc.

## 2018-08-20 NOTE — MAU Note (Signed)
EMS arrival. Pt having severe abd pain and vag bleeding. Dx with miscarriage yesterday. Pain and bleeding got worse today. Passes golfball sized clot and "someething" else came out

## 2018-08-20 NOTE — MAU Provider Note (Signed)
History     CSN: [id number]  Arrival date and time: 08/20/18 1836  Chief Complaint  Patient presents with  . Vaginal Bleeding   [id number] @10 .6 wks by LMP presenting via EMS for worsening abd pain and VB. Was seen last night in MAU and found to have IUP at [redacted]w[redacted]d with no cardiac activity, and abnormal YS. Reports LAP worsened today making her feel week and not able to walk. Rates 10/10. Bleeding has been heavy, has used many pads today and passed golf ball sized clots and tissue.   OB History    Gravida  7   Para  4   Term  4   Preterm  0   AB  2   Living  4     SAB  1   TAB  1   Ectopic      Multiple  0   Live Births  4           Past Medical History:  Diagnosis Date  . Anemia    previous pregnancy   . Blood transfusion without reported diagnosis     Past Surgical History:  Procedure Laterality Date  . CESAREAN SECTION N/A 09/14/2017   Procedure: CESAREAN SECTION;  Surgeon: Olga Millers, MD;  Location: [place];  Service: Obstetrics;  Laterality: N/A;  . NO PAST SURGERIES      Family History  Problem Relation Age of Onset  . ADD / ADHD Neg Hx   . Alcohol abuse Neg Hx   . Anxiety disorder Neg Hx   . Arthritis Neg Hx   . Asthma Neg Hx   . Birth defects Neg Hx   . Cancer Neg Hx   . COPD Neg Hx   . Depression Neg Hx   . Diabetes Neg Hx   . Drug abuse Neg Hx   . Early death Neg Hx   . Hearing loss Neg Hx   . Heart disease Neg Hx   . Hyperlipidemia Neg Hx   . Intellectual disability Neg Hx   . Kidney disease Neg Hx   . Learning disabilities Neg Hx   . Obesity Neg Hx   . Miscarriages / Stillbirths Neg Hx   . Stroke Neg Hx   . Vision loss Neg Hx   . Varicose Veins Neg Hx   . Hypertension Neg Hx     Social History   Tobacco Use  . Smoking status: Never Smoker  . Smokeless tobacco: Never Used  Substance Use Topics  . Alcohol use: Not Currently  . Drug use: Yes    Types: Marijuana    Allergies: No Known  Allergies  Medications Prior to Admission  Medication Sig Dispense Refill Last Dose  . oxyCODONE-acetaminophen (PERCOCET/ROXICET) 5-325 MG tablet Take 1 tablet by mouth every 4 (four) hours as needed for up to 5 doses for severe pain. 5 tablet 0     Review of Systems  Constitutional: Negative for fever.  Gastrointestinal: Positive for abdominal pain.  Genitourinary: Positive for vaginal bleeding.   Physical Exam   Blood pressure (!) 108/54, pulse 63, temperature 97.7 F (36.5 C), last menstrual period 06/05/2018, unknown if currently breastfeeding.  Physical Exam  Nursing note and vitals reviewed. Constitutional: She is oriented to person, place, and time. She appears well-developed and well-nourished. She appears distressed.  HENT:  Head: Normocephalic and atraumatic.  Respiratory: Effort normal. No respiratory distress.  Genitourinary:    Genitourinary Comments: External: no lesions or erythema Vagina: rugated, pink,  moist, large amt clots, POCs in vault, removed with forcep Cervix 1cm    Musculoskeletal: Normal range of motion.  Neurological: She is alert and oriented to person, place, and time.   Results for orders placed or performed during the hospital encounter of 08/20/18 (from the past 24 hour(s))  CBC     Status: Abnormal   Collection Time: 08/20/18  7:05 PM  Result Value Ref Range   WBC 11.7 (H) 4.0 - 10.5 K/uL   RBC 4.12 3.87 - 5.11 MIL/uL   Hemoglobin 11.6 (L) 12.0 - 15.0 g/dL   HCT 34.9 (L) 36.0 - 46.0 %   MCV 84.7 80.0 - 100.0 fL   MCH 28.2 26.0 - 34.0 pg   MCHC 33.2 30.0 - 36.0 g/dL   RDW 15.6 (H) 11.5 - 15.5 %   Platelets 200 150 - 400 K/uL   nRBC 0.0 0.0 - 0.2 %  hCG, quantitative, pregnancy     Status: Abnormal   Collection Time: 08/20/18  7:05 PM  Result Value Ref Range   hCG, Beta Chain, Quant, S 2,129 (H) <5 mIU/mL   US Ob Transvaginal  Result Date: 08/20/2018 CLINICAL DATA:  Pregnant patient with vaginal bleeding. EXAM: TRANSVAGINAL OB  ULTRASOUND TECHNIQUE: Transvaginal ultrasound was performed for complete evaluation of the gestation as well as the maternal uterus, adnexal regions, and pelvic cul-de-sac. COMPARISON:  Ob ultrasound 08/19/2018. FINDINGS: Intrauterine gestational sac: Gestational sac seen on yesterday's examination is no longer present. The endometrium is heterogeneous and thickened at 2.5 cm. There is flow in the endometrium on Doppler imaging. 4.5 cm fibroid noted. Yolk sac:  Not visualized. Embryo:  Not visualized. Cardiac Activity: Not applicable. Subchorionic hemorrhage:  Not applicable. Maternal uterus/adnexae: Appear normal. Trace amount of free pelvic fluid noted. IMPRESSION: Intrauterine gestational sac seen on yesterday's examination is no longer present. Thickening of the endometrium with flow on Doppler imaging is consistent presence of products of conception and likely abortion in progress given vaginal bleeding. Electronically Signed   By: Inge Rise M.D.   On: 08/20/2018 20:23   MAU Course  Procedures Orders Placed This Encounter  Procedures  . US OB Transvaginal    R/o retained POCs    Standing Status:   Standing    Number of Occurrences:   1    Order Specific Question:   Symptom/Reason for Exam    Answer:   Vaginal bleeding in pregnancy [[id number]]  . CBC    Standing Status:   Standing    Number of Occurrences:   1  . hCG, quantitative, pregnancy    Standing Status:   Standing    Number of Occurrences:   1  . Discharge patient    Order Specific Question:   Discharge disposition    Answer:   01-Home or Self Care [1]    Order Specific Question:   Discharge patient date    Answer:   08/20/2018   Meds ordered this encounter  Medications  . lactated ringers bolus 1,000 mL  . HYDROmorphone (DILAUDID) injection 1 mg  . ibuprofen (ADVIL) 600 MG tablet    Sig: Take 1 tablet (600 mg total) by mouth every 6 (six) hours as needed for mild pain.    Dispense:  30 tablet    Refill:  0    Order  Specific Question:   Supervising Provider    Answer:   Sloan Leiter [[id number]]  . misoprostol (CYTOTEC) tablet 600 mcg   MDM Labs and [place] ordered. Bleeding  minimal. Pt feeling better. Possible small amt retained POCs, consult with Dr. Glo Herring, plan for Cytotec. Stable for discharge home.   Assessment and Plan   1. SAB (spontaneous abortion)   2. Vaginal bleeding in pregnancy   3. Blood type, Rh positive    Discharge home Follow up at [facility] in 2 weeks-pt has appt Bleeding/return precautions Rx Ibuprofen   Allergies as of 08/20/2018   No Known Allergies     Medication List    STOP taking these medications   oxyCODONE-acetaminophen 5-325 MG tablet Commonly known as:  PERCOCET/ROXICET     TAKE these medications   ibuprofen 600 MG tablet Commonly known as:  ADVIL Take 1 tablet (600 mg total) by mouth every 6 (six) hours as needed for mild pain.      Julianne Handler, CNM 08/20/2018, 8:35 PM

## 2018-09-30 ENCOUNTER — Emergency Department (HOSPITAL_COMMUNITY)
Admission: EM | Admit: 2018-09-30 | Discharge: 2018-09-30 | Disposition: A | Discharge status: Home / Self Care | Payer: Medicaid Other | Attending: Emergency Medicine | Admitting: Emergency Medicine

## 2018-09-30 ENCOUNTER — Other Ambulatory Visit: Payer: Self-pay

## 2018-09-30 ENCOUNTER — Encounter (HOSPITAL_COMMUNITY): Payer: Self-pay

## 2018-09-30 DIAGNOSIS — Z202 Contact with and (suspected) exposure to infections with a predominantly sexual mode of transmission: Secondary | ICD-10-CM | POA: Diagnosis not present

## 2018-09-30 DIAGNOSIS — Z5321 Procedure and treatment not carried out due to patient leaving prior to being seen by health care provider: Secondary | ICD-10-CM | POA: Diagnosis not present

## 2018-09-30 NOTE — ED Triage Notes (Signed)
Pt states that she is having abd pain, and is concerned for a UTI. Pt describes painful intercourse. Pt is concerned for an STD.

## 2018-09-30 NOTE — ED Notes (Signed)
Called 3 times for room with no relief.

## 2018-10-05 ENCOUNTER — Emergency Department (HOSPITAL_COMMUNITY)
Admission: EM | Admit: 2018-10-05 | Discharge: 2018-10-05 | Disposition: A | Discharge status: Home / Self Care | Payer: Medicaid Other | Attending: Emergency Medicine | Admitting: Emergency Medicine

## 2018-10-05 ENCOUNTER — Other Ambulatory Visit: Payer: Self-pay

## 2018-10-05 ENCOUNTER — Encounter (HOSPITAL_COMMUNITY): Payer: Self-pay | Admitting: *Deleted

## 2018-10-05 DIAGNOSIS — Z791 Long term (current) use of non-steroidal anti-inflammatories (NSAID): Secondary | ICD-10-CM | POA: Diagnosis not present

## 2018-10-05 DIAGNOSIS — A599 Trichomoniasis, unspecified: Secondary | ICD-10-CM

## 2018-10-05 DIAGNOSIS — R102 Pelvic and perineal pain: Secondary | ICD-10-CM

## 2018-10-05 DIAGNOSIS — N739 Female pelvic inflammatory disease, unspecified: Secondary | ICD-10-CM

## 2018-10-05 DIAGNOSIS — N898 Other specified noninflammatory disorders of vagina: Secondary | ICD-10-CM | POA: Diagnosis not present

## 2018-10-05 LAB — URINALYSIS, ROUTINE W REFLEX MICROSCOPIC
Bilirubin Urine: NEGATIVE
Glucose, UA: NEGATIVE mg/dL
Hgb urine dipstick: NEGATIVE
Ketones, ur: 5 mg/dL — AB
Leukocytes,Ua: NEGATIVE
Nitrite: NEGATIVE
Protein, ur: 30 mg/dL — AB
Specific Gravity, Urine: 1.031 — ABNORMAL HIGH (ref 1.005–1.030)
pH: 5 (ref 5.0–8.0)

## 2018-10-05 LAB — WET PREP, GENITAL
Sperm: NONE SEEN
Yeast Wet Prep HPF POC: NONE SEEN

## 2018-10-05 LAB — GC/CHLAMYDIA PROBE AMP (~~LOC~~) NOT AT ARMC
Chlamydia: NEGATIVE
Neisseria Gonorrhea: NEGATIVE

## 2018-10-05 LAB — PREGNANCY, URINE: Preg Test, Ur: NEGATIVE

## 2018-10-05 LAB — POC URINE PREG, ED: Preg Test, Ur: NEGATIVE

## 2018-10-05 MED ORDER — CEFTRIAXONE SODIUM 250 MG IJ SOLR
250.0000 mg | Freq: Once | INTRAMUSCULAR | Status: AC
  Administered 2018-10-05: 06:00:00 250 mg via INTRAMUSCULAR
  Filled 2018-10-05: qty 250

## 2018-10-05 MED ORDER — METRONIDAZOLE 500 MG PO TABS
2000.0000 mg | ORAL_TABLET | Freq: Once | ORAL | Status: AC
  Administered 2018-10-05: 06:00:00 2000 mg via ORAL
  Filled 2018-10-05: qty 4

## 2018-10-05 MED ORDER — ONDANSETRON 4 MG PO TBDP
4.0000 mg | ORAL_TABLET | Freq: Once | ORAL | Status: AC
  Administered 2018-10-05: 06:00:00 4 mg via ORAL
  Filled 2018-10-05: qty 1

## 2018-10-05 MED ORDER — LIDOCAINE HCL (PF) 1 % IJ SOLN
5.0000 mL | Freq: Once | INTRAMUSCULAR | Status: AC
  Administered 2018-10-05: 06:00:00 5 mL
  Filled 2018-10-05: qty 5

## 2018-10-05 MED ORDER — AZITHROMYCIN 250 MG PO TABS
1000.0000 mg | ORAL_TABLET | Freq: Once | ORAL | Status: AC
  Administered 2018-10-05: 06:00:00 1000 mg via ORAL
  Filled 2018-10-05: qty 4

## 2018-10-05 MED ORDER — DOXYCYCLINE HYCLATE 100 MG PO CAPS: 100.0000 mg | ORAL_CAPSULE | Freq: Two times a day (BID) | ORAL | 0 refills | Status: DC

## 2018-10-05 NOTE — Discharge Instructions (Addendum)
1. Medications: Doxycycline, usual home medications 2. Treatment: rest, drink plenty of fluids, use a condom with every sexual encounter 3. Follow Up: Please followup with your primary doctor in 3 days for discussion of your diagnoses and further evaluation after today's visit; if you do not have a primary care doctor use the resource guide provided to find one; Please return to the ER for worsening symptoms, high fevers or persistent vomiting.  You have been tested for chlamydia and gonorrhea.  These results will be available in approximately 3 days.  Please inform all sexual partners if you test positive for any of these diseases.

## 2018-10-05 NOTE — ED Triage Notes (Signed)
Pain before and after she urinates (pelvic), concerned about having UTI. White vaginal discharge. Symptoms for 1.5 weeks

## 2018-10-05 NOTE — ED Provider Notes (Signed)
[place] EMERGENCY DEPARTMENT Provider Note   CSN: [id number] Arrival date & time: 10/05/18  7628    History   Chief Complaint Chief Complaint  Patient presents with  . Pelvic Pain    HPI Kimberly Morris is a 28 y.o. female [phone] with a hx of anemia presents to the Emergency Department complaining of gradual, persistent, progressively worsening lower abd and pelvic pain 1.5-2 weeks ago.  Pt reports pain is worse just before and after urinating, but denies diarrhea, hematuria, urinary urgency or urinary frequency.  Patient additionally reports associated vaginal discharge new in the last 2 weeks.  She reports she has 64 female sexual partner.  She reports she has been on and off with this person for a while.  They do not use any birth control or condoms.  She reports she has previously had an STD from this person.  She is concerned about STDs today.  She denies fever, chills, headache, neck pain, chest pain, shortness of breath, vomiting, diarrhea, weakness, dizziness, syncope.     The history is provided by the patient and medical records.    Past Medical History:  Diagnosis Date  . Anemia    previous pregnancy   . Blood transfusion without reported diagnosis     Patient Active Problem List   Diagnosis Date Noted  . Pregnancy 09/14/2017  . Indication for care in labor and delivery, antepartum 09/13/2017  . False labor 09/01/2017    Past Surgical History:  Procedure Laterality Date  . CESAREAN SECTION N/A 09/14/2017   Procedure: CESAREAN SECTION;  Surgeon: Olga Millers, MD;  Location: [place];  Service: Obstetrics;  Laterality: N/A;  . NO PAST SURGERIES       OB History    Gravida  7   Para  4   Term  4   Preterm  0   AB  2   Living  4     SAB  1   TAB  1   Ectopic      Multiple  0   Live Births  4            Home Medications    Prior to Admission medications   Medication Sig Start Date End Date Taking?  Authorizing Provider  doxycycline (VIBRAMYCIN) 100 MG capsule Take 1 capsule (100 mg total) by mouth 2 (two) times daily. 10/05/18   Teddie Curd, Jarrett Soho, PA-C  ibuprofen (ADVIL) 600 MG tablet Take 1 tablet (600 mg total) by mouth every 6 (six) hours as needed for mild pain. 08/20/18   Julianne Handler, CNM    Family History Family History  Problem Relation Age of Onset  . ADD / ADHD Neg Hx   . Alcohol abuse Neg Hx   . Anxiety disorder Neg Hx   . Arthritis Neg Hx   . Asthma Neg Hx   . Birth defects Neg Hx   . Cancer Neg Hx   . COPD Neg Hx   . Depression Neg Hx   . Diabetes Neg Hx   . Drug abuse Neg Hx   . Early death Neg Hx   . Hearing loss Neg Hx   . Heart disease Neg Hx   . Hyperlipidemia Neg Hx   . Intellectual disability Neg Hx   . Kidney disease Neg Hx   . Learning disabilities Neg Hx   . Obesity Neg Hx   . Miscarriages / Stillbirths Neg Hx   . Stroke Neg Hx   .  Vision loss Neg Hx   . Varicose Veins Neg Hx   . Hypertension Neg Hx     Social History Social History   Tobacco Use  . Smoking status: Never Smoker  . Smokeless tobacco: Never Used  Substance Use Topics  . Alcohol use: Not Currently  . Drug use: Yes    Types: Marijuana     Allergies   Patient has no known allergies.   Review of Systems Review of Systems  Constitutional: Negative for appetite change, diaphoresis, fatigue, fever and unexpected weight change.  HENT: Negative for mouth sores.   Eyes: Negative for visual disturbance.  Respiratory: Negative for cough, chest tightness, shortness of breath and wheezing.   Cardiovascular: Negative for chest pain.  Gastrointestinal: Negative for abdominal pain, constipation, diarrhea, nausea and vomiting.  Endocrine: Negative for polydipsia, polyphagia and polyuria.  Genitourinary: Positive for pelvic pain and vaginal discharge. Negative for dysuria, frequency, hematuria and urgency.  Musculoskeletal: Negative for back pain and neck stiffness.  Skin:  Negative for rash.  Allergic/Immunologic: Negative for immunocompromised state.  Neurological: Negative for syncope, light-headedness and headaches.  Hematological: Does not bruise/bleed easily.  Psychiatric/Behavioral: Negative for sleep disturbance. The patient is not nervous/anxious.      Physical Exam Updated Vital Signs BP 113/69 (BP Location: Right Arm)   Pulse 88   Temp 98.2 F (36.8 C) (Oral)   Resp 18   LMP 09/16/2018   SpO2 99%   Physical Exam Vitals signs and nursing note reviewed. Exam conducted with a chaperone present.  Constitutional:      General: She is not in acute distress.    Appearance: She is well-developed. She is not diaphoretic.  HENT:     Head: Normocephalic and atraumatic.  Eyes:     Conjunctiva/sclera: Conjunctivae normal.  Neck:     Musculoskeletal: Normal range of motion.  Cardiovascular:     Rate and Rhythm: Normal rate and regular rhythm.  Pulmonary:     Effort: Pulmonary effort is normal. No respiratory distress.  Abdominal:     General: Bowel sounds are normal.     Palpations: Abdomen is soft.     Tenderness: There is no abdominal tenderness. There is no guarding or rebound.     Hernia: There is no hernia in the left inguinal area.  Genitourinary:    General: Normal vulva.     Exam position: Supine.     Labia:        Right: No rash, tenderness or lesion.        Left: No rash, tenderness or lesion.      Vagina: No signs of injury and foreign body. Vaginal discharge (scant white) present. No erythema, tenderness or bleeding.     Cervix: No cervical motion tenderness, discharge or friability.     Uterus: Not deviated, not enlarged, not fixed and not tender.      Adnexa:        Right: No mass, tenderness or fullness.         Left: No mass, tenderness or fullness.       Musculoskeletal: Normal range of motion.  Skin:    General: Skin is warm and dry.     Findings: No erythema.  Neurological:     Mental Status: She is alert.       ED Treatments / Results  Labs (all labs ordered are listed, but only abnormal results are displayed) Labs Reviewed  WET PREP, GENITAL - Abnormal; Notable for the following components:  Result Value   Trich, Wet Prep PRESENT (*)    Clue Cells Wet Prep HPF POC PRESENT (*)    WBC, Wet Prep HPF POC MANY (*)    All other components within normal limits  URINALYSIS, ROUTINE W REFLEX MICROSCOPIC - Abnormal; Notable for the following components:   Specific Gravity, Urine 1.031 (*)    Ketones, ur 5 (*)    Protein, ur 30 (*)    Bacteria, UA RARE (*)    All other components within normal limits  URINE CULTURE  PREGNANCY, URINE  POC URINE PREG, ED  GC/CHLAMYDIA PROBE AMP ([place]) NOT AT [facility]     Procedures Procedures (including critical care time)  Medications Ordered in ED Medications  metroNIDAZOLE (FLAGYL) tablet 2,000 mg (2,000 mg Oral Given 10/05/18 0627)  azithromycin (ZITHROMAX) tablet 1,000 mg (1,000 mg Oral Given 10/05/18 0627)  ondansetron (ZOFRAN-ODT) disintegrating tablet 4 mg (4 mg Oral Given 10/05/18 0626)  cefTRIAXone (ROCEPHIN) injection 250 mg (250 mg Intramuscular Given 10/05/18 0627)  lidocaine (PF) (XYLOCAINE) 1 % injection 5 mL (5 mLs Other Given 10/05/18 0051)     Initial Impression / Assessment and Plan / ED Course  I have reviewed the triage vital signs and the nursing notes.  Pertinent labs & imaging results that were available during my care of the patient were reviewed by me and considered in my medical decision making (see chart for details).        Lower abd pain.  Concern for STD vs UTI.  UA without evidence of urinary tract infection.  Wet prep positive for Trichomonas, clue cells and white blood cells.  Concern for STD and possible early.  Pt is hemodynamically stable and is without cervical motion tenderness on pelvic exam. Pt has also been treated with Flagyl for Bacterial Vaginosis and Trich. Pt has been treated prophylactically with  azithromycin and Rocephin due to pts history, pelvic exam, and wet prep with increased WBCs.  Pt understands that they have GC/Chlamydia cultures pending and that they will need to inform all sexual partners if results return positive.  Pt has been advised to not drink alcohol while on Flagyl.  Pt also given Rx for Doxycycline to treat possible early PID.  Patient to be discharged with instructions to follow up with OBGYN/PCP. Discussed importance of using protection when sexually active.   Final Clinical Impressions(s) / ED Diagnoses   Final diagnoses:  Pelvic pain in female  Trichimoniasis  Vaginal discharge  PID (pelvic inflammatory disease)    ED Discharge Orders         Ordered    doxycycline (VIBRAMYCIN) 100 MG capsule  2 times daily     10/05/18 0636           Alveta Quintela, Jarrett Soho, PA-C 10/05/18 1021    Orpah Greek, MD 10/05/18 [phone]

## 2018-10-06 LAB — URINE CULTURE: Culture: <10000 — AB

## 2018-10-29 DIAGNOSIS — Z1159 Encounter for screening for other viral diseases: Secondary | ICD-10-CM | POA: Diagnosis not present

## 2018-12-29 ENCOUNTER — Ambulatory Visit (HOSPITAL_COMMUNITY)
Admission: EM | Admit: 2018-12-29 | Discharge: 2018-12-29 | Disposition: A | Discharge status: Home / Self Care | Payer: Medicaid Other | Attending: Family Medicine | Admitting: Family Medicine

## 2018-12-29 ENCOUNTER — Other Ambulatory Visit: Payer: Self-pay

## 2018-12-29 ENCOUNTER — Encounter (HOSPITAL_COMMUNITY): Payer: Self-pay

## 2018-12-29 DIAGNOSIS — N760 Acute vaginitis: Secondary | ICD-10-CM | POA: Insufficient documentation

## 2018-12-29 DIAGNOSIS — B9689 Other specified bacterial agents as the cause of diseases classified elsewhere: Secondary | ICD-10-CM | POA: Diagnosis not present

## 2018-12-29 LAB — POCT URINALYSIS DIP (DEVICE)
Bilirubin Urine: NEGATIVE
Glucose, UA: NEGATIVE mg/dL
Hgb urine dipstick: NEGATIVE
Ketones, ur: NEGATIVE mg/dL
Leukocytes,Ua: NEGATIVE
Nitrite: NEGATIVE
Protein, ur: NEGATIVE mg/dL
Specific Gravity, Urine: >=1.03 (ref 1.005–1.030)
Urobilinogen, UA: 0.2 mg/dL (ref 0.0–1.0)
pH: 6.5 (ref 5.0–8.0)

## 2018-12-29 MED ORDER — METRONIDAZOLE 500 MG PO TABS: 500.0000 mg | ORAL_TABLET | Freq: Two times a day (BID) | ORAL | 0 refills | Status: DC

## 2018-12-29 NOTE — ED Provider Notes (Signed)
[place]    CSN: [id number] Arrival date & time: 12/29/18  1729      History   Chief Complaint Chief Complaint  Patient presents with  . Vaginal Discharge    HPI Kimberly Morris is a 28 y.o. female.   Patient has vaginal discharge with odor x3 days.  Also has some dysuria but denies urinary frequency.  Has a history of BV and trichomonas. HPI  Past Medical History:  Diagnosis Date  . Anemia    previous pregnancy   . Blood transfusion without reported diagnosis     Patient Active Problem List   Diagnosis Date Noted  . Pregnancy 09/14/2017  . Indication for care in labor and delivery, antepartum 09/13/2017  . False labor 09/01/2017    Past Surgical History:  Procedure Laterality Date  . CESAREAN SECTION N/A 09/14/2017   Procedure: CESAREAN SECTION;  Surgeon: Olga Millers, MD;  Location: [place];  Service: Obstetrics;  Laterality: N/A;  . NO PAST SURGERIES      OB History    Gravida  7   Para  4   Term  4   Preterm  0   AB  2   Living  4     SAB  1   TAB  1   Ectopic      Multiple  0   Live Births  4            Home Medications    Prior to Admission medications   Medication Sig Start Date End Date Taking? Authorizing Provider  doxycycline (VIBRAMYCIN) 100 MG capsule Take 1 capsule (100 mg total) by mouth 2 (two) times daily. 10/05/18   Muthersbaugh, Jarrett Soho, PA-C  ibuprofen (ADVIL) 600 MG tablet Take 1 tablet (600 mg total) by mouth every 6 (six) hours as needed for mild pain. 08/20/18   Julianne Handler, CNM  metroNIDAZOLE (FLAGYL) 500 MG tablet Take 1 tablet (500 mg total) by mouth 2 (two) times daily. 12/29/18   Wardell Honour, MD    Family History Family History  Problem Relation Age of Onset  . ADD / ADHD Neg Hx   . Alcohol abuse Neg Hx   . Anxiety disorder Neg Hx   . Arthritis Neg Hx   . Asthma Neg Hx   . Birth defects Neg Hx   . Cancer Neg Hx   . COPD Neg Hx   . Depression Neg Hx   . Diabetes Neg  Hx   . Drug abuse Neg Hx   . Early death Neg Hx   . Hearing loss Neg Hx   . Heart disease Neg Hx   . Hyperlipidemia Neg Hx   . Intellectual disability Neg Hx   . Kidney disease Neg Hx   . Learning disabilities Neg Hx   . Obesity Neg Hx   . Miscarriages / Stillbirths Neg Hx   . Stroke Neg Hx   . Vision loss Neg Hx   . Varicose Veins Neg Hx   . Hypertension Neg Hx     Social History Social History   Tobacco Use  . Smoking status: Never Smoker  . Smokeless tobacco: Never Used  Substance Use Topics  . Alcohol use: Not Currently  . Drug use: Yes    Types: Marijuana     Allergies   Patient has no known allergies.   Review of Systems Review of Systems  Genitourinary: Positive for dysuria. Negative for frequency.  Neurological: Positive for syncope.  All other systems reviewed and are negative.    Physical Exam Triage Vital Signs ED Triage Vitals  Enc Vitals Group     BP 12/29/18 1831 123/76     Pulse Rate 12/29/18 1831 68     Resp 12/29/18 1831 16     Temp 12/29/18 1831 98.1 F (36.7 C)     Temp Source 12/29/18 1831 Temporal     SpO2 12/29/18 1831 100 %     Weight 12/29/18 1844 130 lb (59 kg)     Height --      Head Circumference --      Peak Flow --      Pain Score 12/29/18 1843 4     Pain Loc --      Pain Edu? --      Excl. in [place]? --    No data found.  Updated Vital Signs BP 123/76 (BP Location: Left Arm)   Pulse 68   Temp 98.1 F (36.7 C) (Temporal)   Resp 16   Wt 59 kg   LMP 12/08/2018   SpO2 100%   BMI 23.03 kg/m   Visual Acuity Right Eye Distance:   Left Eye Distance:   Bilateral Distance:    Right Eye Near:   Left Eye Near:    Bilateral Near:     Physical Exam Vitals signs and nursing note reviewed.  Constitutional:      Appearance: Normal appearance.  Abdominal:     General: Abdomen is flat. Bowel sounds are normal.     Comments: No CVA or suprapubic tenderness  Neurological:     Mental Status: She is alert.   Urinalysis  negative.  Self collected vaginal swab sent to lab for analysis to rule out BV or and/or trichomonas  UC Treatments / Results  Labs (all labs ordered are listed, but only abnormal results are displayed) Labs Reviewed  POCT URINALYSIS DIP (DEVICE)    EKG   Radiology No results found.  Procedures Procedures (including critical care time)  Medications Ordered in UC Medications - No data to display  Initial Impression / Assessment and Plan / UC Course  I have reviewed the triage vital signs and the nursing notes.  Pertinent labs & imaging results that were available during my care of the patient were reviewed by me and considered in my medical decision making (see chart for details).     Vaginitis, historically BV.  Will treat with metronidazole pending results of wet prep Final Clinical Impressions(s) / UC Diagnoses   Final diagnoses:  BV (bacterial vaginosis)   Discharge Instructions   None    ED Prescriptions    Medication Sig Dispense Auth. Provider   metroNIDAZOLE (FLAGYL) 500 MG tablet Take 1 tablet (500 mg total) by mouth 2 (two) times daily. 14 tablet Wardell Honour, MD     PDMP not reviewed this encounter.   Wardell Honour, MD 12/29/18 [phone]

## 2018-12-29 NOTE — ED Triage Notes (Signed)
Pt states she has a vaginal discharge with a odor. X 3 days and painful urination.

## 2018-12-29 NOTE — ED Notes (Signed)
Patient able to ambulate independently  

## 2018-12-30 LAB — CERVICOVAGINAL ANCILLARY ONLY
Bacterial Vaginitis (gardnerella): POSITIVE — AB
Candida Glabrata: NEGATIVE
Candida Vaginitis: NEGATIVE
Molecular Disclaimer: NEGATIVE
Molecular Disclaimer: NEGATIVE
Molecular Disclaimer: NEGATIVE
Molecular Disclaimer: NORMAL
Trichomonas: NEGATIVE

## 2020-04-07 NOTE — L&D Delivery Note (Addendum)
Patient was C/C/+3 and pushed for 2 minutes withOUT epidural.    NSVD  female infant, Apgars 9,9, weight P.   The patient had no lacerations. Fundus was firm. EBL was expected amount. Placenta was delivered intact. Vagina was clear.  Delayed cord clamping done for 30-60 seconds while warming baby. Baby was vigorous and doing skin to skin with mother.  Kimberly Morris

## 2020-06-29 ENCOUNTER — Ambulatory Visit (HOSPITAL_COMMUNITY)
Admission: EM | Admit: 2020-06-29 | Discharge: 2020-06-29 | Disposition: A | Discharge status: Home / Self Care | Payer: Medicaid Other | Attending: Student | Admitting: Student

## 2020-06-29 ENCOUNTER — Other Ambulatory Visit: Payer: Self-pay

## 2020-06-29 DIAGNOSIS — R109 Unspecified abdominal pain: Secondary | ICD-10-CM | POA: Diagnosis not present

## 2020-06-29 DIAGNOSIS — N898 Other specified noninflammatory disorders of vagina: Secondary | ICD-10-CM | POA: Diagnosis not present

## 2020-06-29 DIAGNOSIS — Z3201 Encounter for pregnancy test, result positive: Secondary | ICD-10-CM

## 2020-06-29 DIAGNOSIS — R350 Frequency of micturition: Secondary | ICD-10-CM

## 2020-06-29 DIAGNOSIS — Z113 Encounter for screening for infections with a predominantly sexual mode of transmission: Secondary | ICD-10-CM

## 2020-06-29 LAB — POCT URINALYSIS DIPSTICK, ED / UC
Bilirubin Urine: NEGATIVE
Glucose, UA: NEGATIVE mg/dL
Hgb urine dipstick: NEGATIVE
Ketones, ur: NEGATIVE mg/dL
Nitrite: NEGATIVE
Protein, ur: NEGATIVE mg/dL
Specific Gravity, Urine: >=1.03 (ref 1.005–1.030)
Urobilinogen, UA: 0.2 mg/dL (ref 0.0–1.0)
pH: 6.5 (ref 5.0–8.0)

## 2020-06-29 LAB — POC URINE PREG, ED: Preg Test, Ur: POSITIVE — AB

## 2020-06-29 NOTE — ED Provider Notes (Addendum)
[place]    CSN: [id number] Arrival date & time: 06/29/20  1246      History   Chief Complaint Chief Complaint  Patient presents with  . Abdominal Pain  . Urinary Frequency  . Vaginal Discharge    HPI Kimberly Morris is a 30 y.o. female presenting with abdominal pain, urinary frequency, and vaginal discharge following seeing boyfriend 1 week ago. History anemia in previous pregnancy.  Says the vaginal discharge has changed in appearance over the last week.  Currently describes this as thin and white.  Denies external itching, lesions, rashes.  States that she is having some occasional lower crampy abdominal pain. Endorses "discomfort" after urinating, but denies dysuria. Denies hematuria, dysuria, frequency, urgency, back pain, n/v/d, fevers/chills.   HPI  Past Medical History:  Diagnosis Date  . Anemia    previous pregnancy   . Blood transfusion without reported diagnosis     Patient Active Problem List   Diagnosis Date Noted  . Pregnancy 09/14/2017  . Indication for care in labor and delivery, antepartum 09/13/2017  . False labor 09/01/2017    Past Surgical History:  Procedure Laterality Date  . CESAREAN SECTION N/A 09/14/2017   Procedure: CESAREAN SECTION;  Surgeon: Olga Millers, MD;  Location: [facility];  Service: Obstetrics;  Laterality: N/A;  . NO PAST SURGERIES      OB History    Gravida  7   Para  4   Term  4   Preterm  0   AB  2   Living  4     SAB  1   IAB  1   Ectopic      Multiple  0   Live Births  4            Home Medications    Prior to Admission medications   Medication Sig Start Date End Date Taking? Authorizing Provider  doxycycline (VIBRAMYCIN) 100 MG capsule Take 1 capsule (100 mg total) by mouth 2 (two) times daily. 10/05/18   Muthersbaugh, Jarrett Soho, PA-C  ibuprofen (ADVIL) 600 MG tablet Take 1 tablet (600 mg total) by mouth every 6 (six) hours as needed for mild pain. 08/20/18   Julianne Handler,  CNM  metroNIDAZOLE (FLAGYL) 500 MG tablet Take 1 tablet (500 mg total) by mouth 2 (two) times daily. 12/29/18   Wardell Honour, MD    Family History Family History  Problem Relation Age of Onset  . ADD / ADHD Neg Hx   . Alcohol abuse Neg Hx   . Anxiety disorder Neg Hx   . Arthritis Neg Hx   . Asthma Neg Hx   . Birth defects Neg Hx   . Cancer Neg Hx   . COPD Neg Hx   . Depression Neg Hx   . Diabetes Neg Hx   . Drug abuse Neg Hx   . Early death Neg Hx   . Hearing loss Neg Hx   . Heart disease Neg Hx   . Hyperlipidemia Neg Hx   . Intellectual disability Neg Hx   . Kidney disease Neg Hx   . Learning disabilities Neg Hx   . Obesity Neg Hx   . Miscarriages / Stillbirths Neg Hx   . Stroke Neg Hx   . Vision loss Neg Hx   . Varicose Veins Neg Hx   . Hypertension Neg Hx     Social History Social History   Tobacco Use  . Smoking status: Never Smoker  .  Smokeless tobacco: Never Used  Vaping Use  . Vaping Use: Never used  Substance Use Topics  . Alcohol use: Not Currently  . Drug use: Yes    Types: Marijuana     Allergies   Patient has no known allergies.   Review of Systems Review of Systems  Constitutional: Negative for appetite change, chills, diaphoresis and fever.  Respiratory: Negative for shortness of breath.   Cardiovascular: Negative for chest pain.  Gastrointestinal: Positive for abdominal pain. Negative for blood in stool, constipation, diarrhea, nausea and vomiting.  Genitourinary: Positive for frequency. Negative for decreased urine volume, difficulty urinating, dysuria, flank pain, genital sores, hematuria and urgency.  Musculoskeletal: Negative for back pain.  Neurological: Negative for dizziness, weakness and light-headedness.  All other systems reviewed and are negative.    Physical Exam Triage Vital Signs ED Triage Vitals  Enc Vitals Group     BP 06/29/20 1310 (!) 100/59     Pulse Rate 06/29/20 1310 92     Resp 06/29/20 1310 16     Temp  06/29/20 1310 98.3 F (36.8 C)     Temp Source 06/29/20 1310 Oral     SpO2 06/29/20 1310 98 %     Weight --      Height --      Head Circumference --      Peak Flow --      Pain Score 06/29/20 1307 5     Pain Loc --      Pain Edu? --      Excl. in [place]? --    No data found.  Updated Vital Signs BP (!) 100/59 (BP Location: Right Arm)   Pulse 92   Temp 98.3 F (36.8 C) (Oral)   Resp 16   LMP 05/04/2020   SpO2 98%   Visual Acuity Right Eye Distance:   Left Eye Distance:   Bilateral Distance:    Right Eye Near:   Left Eye Near:    Bilateral Near:     Physical Exam Vitals reviewed.  Constitutional:      General: She is not in acute distress.    Appearance: Normal appearance. She is not ill-appearing.  HENT:     Head: Normocephalic and atraumatic.     Mouth/Throat:     Mouth: Mucous membranes are moist.     Comments: Moist mucous membranes Eyes:     Extraocular Movements: Extraocular movements intact.     Pupils: Pupils are equal, round, and reactive to light.  Cardiovascular:     Rate and Rhythm: Normal rate and regular rhythm.     Heart sounds: Normal heart sounds.  Pulmonary:     Effort: Pulmonary effort is normal.     Breath sounds: Normal breath sounds. No wheezing, rhonchi or rales.  Abdominal:     General: Bowel sounds are normal. There is no distension.     Palpations: Abdomen is soft. There is no mass.     Tenderness: There is abdominal tenderness in the suprapubic area. There is no right CVA tenderness, left CVA tenderness, guarding or rebound. Negative signs include Murphy's sign, Rovsing's sign and McBurney's sign.     Comments: Mild suprapubic discomfort to deep palpation.  Genitourinary:    Comments: Deferred. Skin:    General: Skin is warm.     Capillary Refill: Capillary refill takes less than 2 seconds.     Comments: Good skin turgor  Neurological:     General: No focal deficit present.  Mental Status: She is alert and oriented to person,  place, and time.  Psychiatric:        Mood and Affect: Mood normal.        Behavior: Behavior normal.      UC Treatments / Results  Labs (all labs ordered are listed, but only abnormal results are displayed) Labs Reviewed  POC URINE PREG, ED - Abnormal; Notable for the following components:      Result Value   Preg Test, Ur POSITIVE (*)    All other components within normal limits  POCT URINALYSIS DIPSTICK, ED / UC - Abnormal; Notable for the following components:   Leukocytes,Ua TRACE (*)    All other components within normal limits  URINE CULTURE  CERVICOVAGINAL ANCILLARY ONLY    EKG   Radiology No results found.  Procedures Procedures (including critical care time)  Medications Ordered in UC Medications - No data to display  Initial Impression / Assessment and Plan / UC Course  I have reviewed the triage vital signs and the nursing notes.  Pertinent labs & imaging results that were available during my care of the patient were reviewed by me and considered in my medical decision making (see chart for details).     This patient is a 30 year old female presenting with vaginal discharge. Positive pregnancy test today. Today this pt is afebrile nontachycardic nontachypneic, oxygenating well on room air, no wheezes rhonchi or rales. Very low concern for PID based on exam.  Urine culture with trace leuk. Culture sent.  Will send for G/C, trich, yeast, BV testing. Declines HIV, RPR. Abstain until negative result.  Plan to wait to treat until STI panel results, given positive pregnancy test today.   STRICT return precautions discussed as below.   This chart was dictated using voice recognition software, Dragon. Despite the best efforts of this provider to proofread and correct errors, errors may still occur which can change documentation meaning.   Final Clinical Impressions(s) / UC Diagnoses   Final diagnoses:  Vaginal discharge  Routine screening for STI  (sexually transmitted infection)  Positive pregnancy test     Discharge Instructions     -We are testing you for gonorrhea, chlamydia, trichomonas, BV, yeast.  If any of these are positive, we will call you and can send over treatment at that time. -If you develop new symptoms like worsening of abdominal pain, vaginal spotting, fever/chills-had to the ER for further evaluation management. -I provided information below for [facility] health OB/GYN, and also [facility].  You can try giving them a call to schedule an appointment.    ED Prescriptions    None     PDMP not reviewed this encounter.   Hazel Sams, PA-C 06/29/20 1400    Hazel Sams, PA-C 06/29/20 1423

## 2020-06-29 NOTE — ED Triage Notes (Signed)
Pt presents with lower abdominal pain with urination, urinary frequency, and vaginal discharge xs 1 week.

## 2020-06-29 NOTE — Discharge Instructions (Signed)
-  We are testing you for gonorrhea, chlamydia, trichomonas, BV, yeast.  If any of these are positive, we will call you and can send over treatment at that time. -If you develop new symptoms like worsening of abdominal pain, vaginal spotting, fever/chills-had to the ER for further evaluation management. -I provided information below for [facility] health OB/GYN, and also [facility].  You can try giving them a call to schedule an appointment.

## 2020-07-01 LAB — URINE CULTURE: Culture: >=100000 — AB

## 2020-07-02 ENCOUNTER — Telehealth (HOSPITAL_COMMUNITY): Payer: Self-pay | Admitting: Emergency Medicine

## 2020-07-02 LAB — CERVICOVAGINAL ANCILLARY ONLY
Bacterial Vaginitis (gardnerella): NEGATIVE
Candida Glabrata: NEGATIVE
Candida Vaginitis: POSITIVE — AB
Chlamydia: NEGATIVE
Comment: NEGATIVE
Comment: NEGATIVE
Comment: NEGATIVE
Comment: NEGATIVE
Comment: NEGATIVE
Comment: NORMAL
Neisseria Gonorrhea: NEGATIVE
Trichomonas: NEGATIVE

## 2020-07-02 MED ORDER — TERCONAZOLE 0.4 % VA CREA: 1.0000 | TOPICAL_CREAM | Freq: Every day | VAGINAL | 0 refills | Status: DC

## 2020-07-02 MED ORDER — AMOXICILLIN 875 MG PO TABS: 875.0000 mg | ORAL_TABLET | Freq: Two times a day (BID) | ORAL | 0 refills | Status: AC

## 2020-07-02 NOTE — Telephone Encounter (Signed)
Patient had positive pregnancy test and positive for Lactobacillus on urine culture.  Per Dr. Mannie Stabile Amoxicillin 875mg  BID x 5 days Attempted to reach patient x 1, LVM Prescritpion sent to pharmacy on file

## 2020-08-06 ENCOUNTER — Encounter (HOSPITAL_COMMUNITY): Payer: Self-pay | Admitting: Obstetrics & Gynecology

## 2020-08-06 ENCOUNTER — Other Ambulatory Visit: Payer: Self-pay

## 2020-08-06 ENCOUNTER — Inpatient Hospital Stay (HOSPITAL_COMMUNITY)
Admission: AD | Admit: 2020-08-06 | Discharge: 2020-08-06 | Disposition: A | Discharge status: Home / Self Care | Payer: Medicaid Other | Attending: Family Medicine | Admitting: Family Medicine

## 2020-08-06 DIAGNOSIS — O219 Vomiting of pregnancy, unspecified: Secondary | ICD-10-CM | POA: Diagnosis not present

## 2020-08-06 DIAGNOSIS — R109 Unspecified abdominal pain: Secondary | ICD-10-CM | POA: Insufficient documentation

## 2020-08-06 DIAGNOSIS — O26851 Spotting complicating pregnancy, first trimester: Secondary | ICD-10-CM | POA: Diagnosis not present

## 2020-08-06 DIAGNOSIS — O26891 Other specified pregnancy related conditions, first trimester: Secondary | ICD-10-CM | POA: Insufficient documentation

## 2020-08-06 DIAGNOSIS — Z3A13 13 weeks gestation of pregnancy: Secondary | ICD-10-CM | POA: Diagnosis not present

## 2020-08-06 LAB — URINALYSIS, ROUTINE W REFLEX MICROSCOPIC
Bilirubin Urine: NEGATIVE
Glucose, UA: NEGATIVE mg/dL
Hgb urine dipstick: NEGATIVE
Ketones, ur: NEGATIVE mg/dL
Nitrite: NEGATIVE
Protein, ur: NEGATIVE mg/dL
Specific Gravity, Urine: 1.015 (ref 1.005–1.030)
pH: 7 (ref 5.0–8.0)

## 2020-08-06 NOTE — MAU Note (Signed)
Last Thurs started having some cramping on Left side.  Started spotting that day also. No spotting on Friday or Saturday, though cramping was random.  Sunday started spotting again- pink, Sunday night the pain got worse.  Has had mild cramping today, all on the left side.  Had some pink spotting this morning, has not used the restroom since then.

## 2020-08-06 NOTE — MAU Provider Note (Signed)
History     CSN: [id number]  Arrival date and time: 08/06/20 1447   None     Chief Complaint  Patient presents with  . Abdominal Pain  . Vaginal Bleeding   HPI This is a 30yo [id number] at [redacted]w[redacted]d who presents with vaginal bleeding, which is described as spotting on Thursday and Friday. Had a few cramps over the weekend that were more severe today. No further bleeding.  OB History    Gravida  8   Para  4   Term  4   Preterm  0   AB  2   Living  4     SAB  1   IAB  1   Ectopic      Multiple  0   Live Births  4           Past Medical History:  Diagnosis Date  . Anemia    previous pregnancy   . Blood transfusion without reported diagnosis     Past Surgical History:  Procedure Laterality Date  . CESAREAN SECTION N/A 09/14/2017   Procedure: CESAREAN SECTION;  Surgeon: Olga Millers, MD;  Location: [facility];  Service: Obstetrics;  Laterality: N/A;    Family History  Problem Relation Age of Onset  . Heart disease Mother   . Healthy Father   . ADD / ADHD Neg Hx   . Alcohol abuse Neg Hx   . Anxiety disorder Neg Hx   . Arthritis Neg Hx   . Asthma Neg Hx   . Birth defects Neg Hx   . Cancer Neg Hx   . COPD Neg Hx   . Depression Neg Hx   . Diabetes Neg Hx   . Drug abuse Neg Hx   . Early death Neg Hx   . Hearing loss Neg Hx   . Hyperlipidemia Neg Hx   . Intellectual disability Neg Hx   . Kidney disease Neg Hx   . Learning disabilities Neg Hx   . Obesity Neg Hx   . Miscarriages / Stillbirths Neg Hx   . Stroke Neg Hx   . Vision loss Neg Hx   . Varicose Veins Neg Hx   . Hypertension Neg Hx     Social History   Tobacco Use  . Smoking status: Never Smoker  . Smokeless tobacco: Never Used  Vaping Use  . Vaping Use: Never used  Substance Use Topics  . Alcohol use: Not Currently  . Drug use: Not Currently    Types: Marijuana    Comment: stopped with +pt    Allergies: No Known Allergies  Medications Prior to Admission   Medication Sig Dispense Refill Last Dose  . amoxicillin (AMOXIL) 875 MG tablet Take 875 mg by mouth 2 (two) times daily. Had lost the prescription, got it the end of March.... just found it, on day 3 today    at 0900  . ibuprofen (ADVIL) 600 MG tablet Take 1 tablet (600 mg total) by mouth every 6 (six) hours as needed for mild pain. 30 tablet 0   . terconazole (TERAZOL 7) 0.4 % vaginal cream Place 1 applicator vaginally at bedtime. 45 g 0     Review of Systems  All other systems reviewed and are negative.  Physical Exam   Blood pressure 113/62, pulse 82, temperature 98.4 F (36.9 C), temperature source Oral, resp. rate 17, height 5\' 3"  (1.6 m), weight 62.3 kg, last menstrual period 05/04/2020, SpO2 99 %, unknown if currently breastfeeding.  Physical Exam Vitals and nursing note reviewed.  Constitutional:      Appearance: She is well-developed.  Abdominal:     Tenderness: There is no abdominal tenderness. There is no right CVA tenderness, left CVA tenderness, guarding or rebound.  Skin:    General: Skin is warm and dry.  Neurological:     Mental Status: She is alert.     MAU Course  Procedures Bedside US done showing Viable IUP with HR of 165. Small subchorionic hematoma seen.  MDM  Assessment and Plan   1. Spotting affecting pregnancy in first trimester   2. [redacted] weeks gestation of pregnancy    Discussed hematoma. Pelvic rest. Return precautions given. D/c to home. Establish OB care.  Truett Mainland 08/06/2020, 6:51 PM

## 2020-08-06 NOTE — Discharge Instructions (Signed)
Vaginal Bleeding During Pregnancy, First Trimester A small amount of bleeding from the vagina is common during early pregnancy. This kind of bleeding is also called spotting. Sometimes the bleeding is normal and does not cause problems. At other times, though, bleeding may be a sign of something serious. Normal bleeding in pregnancy can happen:  When the fertilized egg attaches itself to your womb.  When blood vessels change because of the pregnancy.  When you have pelvic exams.  When you have sex. Abnormal bleeding can happen:  When you have an infection.  When you have growths in your womb. The growths are called polyps.  If you are having a miscarriage or at risk of having one.  If you have other problems in your pregnancy. Tell your doctor right away about any bleeding from your vagina. Follow these instructions at home: Watch your bleeding  Watch your condition for any changes. Let your doctor know if you are worried about something.  Try to know what causes your bleeding. Ask yourself these questions: ? Does the bleeding start on its own? ? Does the bleeding start after something is done, such as sex or a pelvic exam?  Use a diary to write the things you see about your bleeding. Write in your diary: ? If the bleeding flows freely without stopping, or if it starts and stops, and then starts again. ? If the bleeding is heavy or light. ? How many pads you use in a day and how much blood is in them.  Tell your doctor if you pass tissue. He or she may want to see it.   Activity  Follow your doctor's instructions about how active you can be. Ask what activities are safe for you.  Do not have sex or orgasms until your doctor says that this is safe.  If needed, make plans for someone to help with your normal activities. General instructions  Take over-the-counter and prescription medicines only as told by your doctor.  Do not take aspirin because it can cause  bleeding.  Do not use tampons.  Do not douche.  Keep all follow-up visits. Contact a doctor if:  You have vaginal bleeding at any time while you are pregnant.  You have cramps.  You have a fever or chills. Get help right away if:  You have very bad cramps in your back or belly (abdomen).  You pass large clots or a lot of tissue from your vagina.  Your bleeding gets worse.  You feel light-headed.  You feel weak.  You pass out (faint).  You have chills.  You are leaking fluid from your vagina.  You have a gush of fluid from your vagina. Summary  Sometimes vaginal bleeding during pregnancy is normal and does not cause problems. At other times, bleeding may be a sign of something serious.  Tell your doctor right away about any bleeding from your vagina.  Follow your doctor's instructions about how active you can be. You may need someone to help you with your normal activities.  Keep all follow-up visits. This information is not intended to replace advice given to you by your health care provider. Make sure you discuss any questions you have with your health care provider. Document Revised: 12/15/2019 Document Reviewed: 12/15/2019 Elsevier Patient Education  2021 [employer]  

## 2020-08-14 DIAGNOSIS — Z3480 Encounter for supervision of other normal pregnancy, unspecified trimester: Secondary | ICD-10-CM | POA: Diagnosis not present

## 2020-08-14 DIAGNOSIS — Z124 Encounter for screening for malignant neoplasm of cervix: Secondary | ICD-10-CM | POA: Diagnosis not present

## 2020-08-14 DIAGNOSIS — N925 Other specified irregular menstruation: Secondary | ICD-10-CM | POA: Diagnosis not present

## 2020-08-14 DIAGNOSIS — Z3A14 14 weeks gestation of pregnancy: Secondary | ICD-10-CM | POA: Diagnosis not present

## 2020-08-14 DIAGNOSIS — N760 Acute vaginitis: Secondary | ICD-10-CM | POA: Diagnosis not present

## 2020-08-14 DIAGNOSIS — O26841 Uterine size-date discrepancy, first trimester: Secondary | ICD-10-CM | POA: Diagnosis not present

## 2020-08-14 LAB — OB RESULTS CONSOLE HEPATITIS B SURFACE ANTIGEN: Hepatitis B Surface Ag: NEGATIVE

## 2020-08-14 LAB — OB RESULTS CONSOLE HIV ANTIBODY (ROUTINE TESTING): HIV: NONREACTIVE

## 2020-08-14 LAB — OB RESULTS CONSOLE RUBELLA ANTIBODY, IGM: Rubella: IMMUNE

## 2020-08-14 LAB — HEPATITIS C ANTIBODY: HCV Ab: NEGATIVE

## 2020-09-21 DIAGNOSIS — Z363 Encounter for antenatal screening for malformations: Secondary | ICD-10-CM | POA: Diagnosis not present

## 2020-09-21 DIAGNOSIS — Z3A20 20 weeks gestation of pregnancy: Secondary | ICD-10-CM | POA: Diagnosis not present

## 2020-10-19 DIAGNOSIS — Z369 Encounter for antenatal screening, unspecified: Secondary | ICD-10-CM | POA: Diagnosis not present

## 2020-10-19 DIAGNOSIS — Z3A24 24 weeks gestation of pregnancy: Secondary | ICD-10-CM | POA: Diagnosis not present

## 2020-11-14 ENCOUNTER — Inpatient Hospital Stay (HOSPITAL_COMMUNITY)
Admission: AD | Admit: 2020-11-14 | Discharge: 2020-11-14 | Disposition: A | Discharge status: Home / Self Care | Payer: Medicaid Other | Attending: Obstetrics and Gynecology | Admitting: Obstetrics and Gynecology

## 2020-11-14 ENCOUNTER — Encounter (HOSPITAL_COMMUNITY): Payer: Self-pay | Admitting: Obstetrics and Gynecology

## 2020-11-14 ENCOUNTER — Other Ambulatory Visit: Payer: Self-pay

## 2020-11-14 DIAGNOSIS — R1030 Lower abdominal pain, unspecified: Secondary | ICD-10-CM | POA: Insufficient documentation

## 2020-11-14 DIAGNOSIS — O99412 Diseases of the circulatory system complicating pregnancy, second trimester: Secondary | ICD-10-CM | POA: Diagnosis not present

## 2020-11-14 DIAGNOSIS — R000 Tachycardia, unspecified: Secondary | ICD-10-CM | POA: Insufficient documentation

## 2020-11-14 DIAGNOSIS — O26892 Other specified pregnancy related conditions, second trimester: Secondary | ICD-10-CM

## 2020-11-14 DIAGNOSIS — Z3A27 27 weeks gestation of pregnancy: Secondary | ICD-10-CM

## 2020-11-14 DIAGNOSIS — R109 Unspecified abdominal pain: Secondary | ICD-10-CM

## 2020-11-14 LAB — URINALYSIS, ROUTINE W REFLEX MICROSCOPIC
Bacteria, UA: NONE SEEN
Bilirubin Urine: NEGATIVE
Glucose, UA: NEGATIVE mg/dL
Hgb urine dipstick: NEGATIVE
Ketones, ur: NEGATIVE mg/dL
Nitrite: NEGATIVE
Protein, ur: NEGATIVE mg/dL
Specific Gravity, Urine: 1.016 (ref 1.005–1.030)
pH: 8 (ref 5.0–8.0)

## 2020-11-14 LAB — WET PREP, GENITAL
Clue Cells Wet Prep HPF POC: NONE SEEN
Sperm: NONE SEEN
Trich, Wet Prep: NONE SEEN

## 2020-11-14 LAB — RAPID URINE DRUG SCREEN, HOSP PERFORMED
Amphetamines: NOT DETECTED
Barbiturates: NOT DETECTED
Benzodiazepines: NOT DETECTED
Cocaine: NOT DETECTED
Opiates: NOT DETECTED
Tetrahydrocannabinol: NOT DETECTED

## 2020-11-14 LAB — CBC WITH DIFFERENTIAL/PLATELET
Abs Immature Granulocytes: 0.05 10*3/uL (ref 0.00–0.07)
Basophils Absolute: 0 10*3/uL (ref 0.0–0.1)
Basophils Relative: 0 %
Eosinophils Absolute: 0.1 10*3/uL (ref 0.0–0.5)
Eosinophils Relative: 1 %
HCT: 30.1 % — ABNORMAL LOW (ref 36.0–46.0)
Hemoglobin: 10 g/dL — ABNORMAL LOW (ref 12.0–15.0)
Immature Granulocytes: 1 %
Lymphocytes Relative: 3 %
Lymphs Abs: 0.2 10*3/uL — ABNORMAL LOW (ref 0.7–4.0)
MCH: 28.5 pg (ref 26.0–34.0)
MCHC: 33.2 g/dL (ref 30.0–36.0)
MCV: 85.8 fL (ref 80.0–100.0)
Monocytes Absolute: 0.8 10*3/uL (ref 0.1–1.0)
Monocytes Relative: 9 %
Neutro Abs: 7 10*3/uL (ref 1.7–7.7)
Neutrophils Relative %: 86 %
Platelets: 172 10*3/uL (ref 150–400)
RBC: 3.51 MIL/uL — ABNORMAL LOW (ref 3.87–5.11)
RDW: 18.3 % — ABNORMAL HIGH (ref 11.5–15.5)
WBC: 8.1 10*3/uL (ref 4.0–10.5)
nRBC: 0 % (ref 0.0–0.2)

## 2020-11-14 MED ORDER — LACTATED RINGERS IV BOLUS
1000.0000 mL | Freq: Once | INTRAVENOUS | Status: AC
  Administered 2020-11-14: 1000 mL via INTRAVENOUS

## 2020-11-14 MED ORDER — CYCLOBENZAPRINE HCL 10 MG PO TABS: 10.0000 mg | ORAL_TABLET | Freq: Two times a day (BID) | ORAL | 0 refills | Status: DC | PRN

## 2020-11-14 MED ORDER — NIFEDIPINE 10 MG PO CAPS
10.0000 mg | ORAL_CAPSULE | ORAL | Status: DC | PRN
  Filled 2020-11-14: qty 1

## 2020-11-14 MED ORDER — CYCLOBENZAPRINE HCL 5 MG PO TABS
10.0000 mg | ORAL_TABLET | Freq: Once | ORAL | Status: AC
  Administered 2020-11-14: 10 mg via ORAL
  Filled 2020-11-14: qty 2

## 2020-11-14 MED ORDER — ACETAMINOPHEN 500 MG PO TABS
1000.0000 mg | ORAL_TABLET | Freq: Once | ORAL | Status: AC
  Administered 2020-11-14: 1000 mg via ORAL
  Filled 2020-11-14: qty 2

## 2020-11-14 NOTE — MAU Note (Signed)
Pt woke up with mild lower abdominal cramping, which now radiates to her back. She reports nausea and vomiting. She rates pain 8/10. Did not try anything for pain. Clear discharge, no vaginal bleeding. +FM

## 2020-11-14 NOTE — MAU Provider Note (Signed)
History     CSN: [id number]  Arrival date and time: 11/14/20 1120   Event Date/Time   First Provider Initiated Contact with Patient 11/14/20 1147      Chief Complaint  Patient presents with   Abdominal Cramping   HPI Jenaveve Skyberg is a 30 y.o. [id number] at 79w5dwho presents to MAU with chief complaint of lower abdominal pain. This is a new problem, onset first thing this morning. Pain score is 8/10 and radiates to her lower back. She reports "this doesn't feel like contractions". She denies aggravating or alleviating factors. She has not taken medication or tried other treatments for this complaint. She denies vaginal bleeding, leaking of fluid, decreased fetal movement, fever, falls, or recent illness.   Patient states she was "up and walking around maybe too much" yesterday and states she did not drink as much water as she normally does.  Patient receives care with GEsmond Plantsand her next appointment is Friday 11/16/2020.  OB History     Gravida  8   Para  4   Term  4   Preterm  0   AB  2   Living  4      SAB  1   IAB  1   Ectopic      Multiple  0   Live Births  4           Past Medical History:  Diagnosis Date   Anemia    previous pregnancy    Blood transfusion without reported diagnosis     Past Surgical History:  Procedure Laterality Date   CESAREAN SECTION N/A 09/14/2017   Procedure: CESAREAN SECTION;  Surgeon: AOlga Millers MD;  [place] [facility]  Service: Obstetrics;  Laterality: N/A;    Family History  Problem Relation Age of Onset   Heart disease Mother    Healthy Father    ADD / ADHD Neg Hx    Alcohol abuse Neg Hx    Anxiety disorder Neg Hx    Arthritis Neg Hx    Asthma Neg Hx    Birth defects Neg Hx    Cancer Neg Hx    COPD Neg Hx    Depression Neg Hx    Diabetes Neg Hx    Drug abuse Neg Hx    Early death Neg Hx    Hearing loss Neg Hx    Hyperlipidemia Neg Hx    Intellectual disability Neg Hx    Kidney  disease Neg Hx    Learning disabilities Neg Hx    Obesity Neg Hx    Miscarriages / Stillbirths Neg Hx    Stroke Neg Hx    Vision loss Neg Hx    Varicose Veins Neg Hx    Hypertension Neg Hx     Social History   Tobacco Use   Smoking status: Never   Smokeless tobacco: Never  Vaping Use   Vaping Use: Never used  Substance Use Topics   Alcohol use: Not Currently   Drug use: Not Currently    Types: Marijuana    Comment: stopped with +pt    Allergies: No Known Allergies  Medications Prior to Admission  Medication Sig Dispense Refill Last Dose   Prenatal Vit-Fe Fumarate-FA (PRENATAL MULTIVITAMIN) TABS tablet Take 1 tablet by mouth daily at 12 noon.   11/13/2020   amoxicillin (AMOXIL) 875 MG tablet Take 875 mg by mouth 2 (two) times daily. Had lost the prescription, got it the end of  March.... just found it, on day 3 today      terconazole (TERAZOL 7) 0.4 % vaginal cream Place 1 applicator vaginally at bedtime. 45 g 0     Review of Systems  Gastrointestinal:  Positive for abdominal pain.  Musculoskeletal:  Positive for back pain.  All other systems reviewed and are negative. Physical Exam   Blood pressure (!) 120/55, pulse (!) 135, temperature 98.5 F (36.9 C), temperature source Oral, resp. rate (!) 22, height '5\' 3"'$  (1.6 m), weight 72.9 kg, last menstrual period 05/04/2020, SpO2 100 %, unknown if currently breastfeeding.  Physical Exam Vitals and nursing note reviewed. Exam conducted with a chaperone present.  Constitutional:      General: She is in acute distress.     Appearance: Normal appearance.  Cardiovascular:     Rate and Rhythm: Normal rate.     Pulses: Normal pulses.     Heart sounds: Normal heart sounds.  Pulmonary:     Effort: Pulmonary effort is normal.     Breath sounds: Normal breath sounds.  Abdominal:     Comments: Gravid  Genitourinary:    Comments: Pelvic exam: External genitalia normal, vaginal walls pink and well rugated, cervix visually closed, no  lesions noted. No abnormal discharge noted   Skin:    Capillary Refill: Capillary refill takes less than 2 seconds.  Neurological:     Mental Status: She is alert and oriented to person, place, and time.  Psychiatric:        Mood and Affect: Mood normal.        Behavior: Behavior normal.        Thought Content: Thought content normal.        Judgment: Judgment normal.    MAU Course  Procedures  --Term SVD x 3, then cesarean in 2019.  --Yeast noted on wet prep, not consistent with pelvic exam. Will defer treatment --Maternal and fetal tachycardia on arrival, resolved with fluid bolus --Pertinent negatives: elevated WBCs, fever, abdominal tenderness, vaginal bleeding --CNM returned to bedside at 1335, pt sleeping.  --Patient declined recheck of cervix prior to delivery  Orders Placed This Encounter  Procedures   Wet prep, genital   Urinalysis, Routine w reflex microscopic Urine, Clean Catch   Rapid urine drug screen (hospital performed)   CBC with Differential/Platelet   Blood draw with IV start   Discharge patient   Patient Vitals for the past 24 hrs:  BP Temp Temp src Pulse Resp SpO2 Height Weight  11/14/20 1458 (!) 112/51 -- -- (!) 109 -- 100 % -- --  11/14/20 1330 -- -- -- -- -- 100 % -- --  11/14/20 1135 (!) 120/55 98.5 F (36.9 C) Oral (!) 135 (!) 22 100 % '5\' 3"'$  (1.6 m) 72.9 kg   Results for orders placed or performed during the hospital encounter of 11/14/20 (from the past 24 hour(s))  Wet prep, genital     Status: Abnormal   Collection Time: 11/14/20 11:57 AM  Result Value Ref Range   Yeast Wet Prep HPF POC PRESENT (A) NONE SEEN   Trich, Wet Prep NONE SEEN NONE SEEN   Clue Cells Wet Prep HPF POC NONE SEEN NONE SEEN   WBC, Wet Prep HPF POC MANY (A) NONE SEEN   Sperm NONE SEEN   CBC with Differential/Platelet     Status: Abnormal   Collection Time: 11/14/20 12:08 PM  Result Value Ref Range   WBC 8.1 4.0 - 10.5 K/uL   RBC 3.51 (L)  3.87 - 5.11 MIL/uL    Hemoglobin 10.0 (L) 12.0 - 15.0 g/dL   HCT 30.1 (L) 36.0 - 46.0 %   MCV 85.8 80.0 - 100.0 fL   MCH 28.5 26.0 - 34.0 pg   MCHC 33.2 30.0 - 36.0 g/dL   RDW 18.3 (H) 11.5 - 15.5 %   Platelets 172 150 - 400 K/uL   nRBC 0.0 0.0 - 0.2 %   Neutrophils Relative % 86 %   Neutro Abs 7.0 1.7 - 7.7 K/uL   Lymphocytes Relative 3 %   Lymphs Abs 0.2 (L) 0.7 - 4.0 K/uL   Monocytes Relative 9 %   Monocytes Absolute 0.8 0.1 - 1.0 K/uL   Eosinophils Relative 1 %   Eosinophils Absolute 0.1 0.0 - 0.5 K/uL   Basophils Relative 0 %   Basophils Absolute 0.0 0.0 - 0.1 K/uL   Immature Granulocytes 1 %   Abs Immature Granulocytes 0.05 0.00 - 0.07 K/uL  Urinalysis, Routine w reflex microscopic Urine, Clean Catch     Status: Abnormal   Collection Time: 11/14/20  1:03 PM  Result Value Ref Range   Color, Urine YELLOW YELLOW   APPearance HAZY (A) CLEAR   Specific Gravity, Urine 1.016 1.005 - 1.030   pH 8.0 5.0 - 8.0   Glucose, UA NEGATIVE NEGATIVE mg/dL   Hgb urine dipstick NEGATIVE NEGATIVE   Bilirubin Urine NEGATIVE NEGATIVE   Ketones, ur NEGATIVE NEGATIVE mg/dL   Protein, ur NEGATIVE NEGATIVE mg/dL   Nitrite NEGATIVE NEGATIVE   Leukocytes,Ua TRACE (A) NEGATIVE   RBC / HPF 0-5 0 - 5 RBC/hpf   WBC, UA 0-5 0 - 5 WBC/hpf   Bacteria, UA NONE SEEN NONE SEEN   Squamous Epithelial / LPF 0-5 0 - 5   Mucus PRESENT   Rapid urine drug screen (hospital performed)     Status: None   Collection Time: 11/14/20  1:03 PM  Result Value Ref Range   Opiates NONE DETECTED NONE DETECTED   Cocaine NONE DETECTED NONE DETECTED   Benzodiazepines NONE DETECTED NONE DETECTED   Amphetamines NONE DETECTED NONE DETECTED   Tetrahydrocannabinol NONE DETECTED NONE DETECTED   Barbiturates NONE DETECTED NONE DETECTED   Meds ordered this encounter  Medications   lactated ringers bolus 1,000 mL   acetaminophen (TYLENOL) tablet 1,000 mg   cyclobenzaprine (FLEXERIL) tablet 10 mg   NIFEdipine (PROCARDIA) capsule 10 mg   lactated  ringers bolus 1,000 mL   cyclobenzaprine (FLEXERIL) 10 MG tablet    Sig: Take 1 tablet (10 mg total) by mouth 2 (two) times daily as needed for muscle spasms.    Dispense:  20 tablet    Refill:  0    Order Specific Question:   Supervising Provider    Answer:   Verita Schneiders A [id number]   Assessment and Plan  --30 y.o. [id number] at [redacted]w[redacted]d --Reactive tracing --Closed cervix --Tachycardia resolved with treatments in MAU --Pain resolved with treatments in MAU --Discharge home in stable condition  SDarlina Rumpf CNM 11/14/2020, 4:22 PM

## 2020-11-15 LAB — GC/CHLAMYDIA PROBE AMP (~~LOC~~) NOT AT ARMC
Chlamydia: NEGATIVE
Comment: NEGATIVE
Comment: NORMAL
Neisseria Gonorrhea: NEGATIVE

## 2020-11-16 DIAGNOSIS — Z3480 Encounter for supervision of other normal pregnancy, unspecified trimester: Secondary | ICD-10-CM | POA: Diagnosis not present

## 2020-11-22 DIAGNOSIS — O99810 Abnormal glucose complicating pregnancy: Secondary | ICD-10-CM | POA: Diagnosis not present

## 2020-12-17 DIAGNOSIS — Z23 Encounter for immunization: Secondary | ICD-10-CM | POA: Diagnosis not present

## 2021-02-08 ENCOUNTER — Telehealth (HOSPITAL_COMMUNITY): Payer: Self-pay | Admitting: *Deleted

## 2021-02-08 DIAGNOSIS — Z3480 Encounter for supervision of other normal pregnancy, unspecified trimester: Secondary | ICD-10-CM | POA: Diagnosis not present

## 2021-02-08 NOTE — Telephone Encounter (Signed)
Preadmission screen  

## 2021-02-09 ENCOUNTER — Encounter (HOSPITAL_COMMUNITY): Payer: Self-pay | Admitting: Obstetrics and Gynecology

## 2021-02-09 ENCOUNTER — Inpatient Hospital Stay (HOSPITAL_COMMUNITY)
Admission: AD | Admit: 2021-02-09 | Discharge: 2021-02-11 | DRG: 806 | Disposition: A | Discharge status: Home / Self Care | Payer: Medicaid Other | Attending: Obstetrics and Gynecology | Admitting: Obstetrics and Gynecology

## 2021-02-09 DIAGNOSIS — O99324 Drug use complicating childbirth: Secondary | ICD-10-CM | POA: Diagnosis present

## 2021-02-09 DIAGNOSIS — O9902 Anemia complicating childbirth: Secondary | ICD-10-CM | POA: Diagnosis present

## 2021-02-09 DIAGNOSIS — Z20822 Contact with and (suspected) exposure to covid-19: Secondary | ICD-10-CM | POA: Diagnosis present

## 2021-02-09 DIAGNOSIS — F1290 Cannabis use, unspecified, uncomplicated: Secondary | ICD-10-CM | POA: Diagnosis present

## 2021-02-09 DIAGNOSIS — O34211 Maternal care for low transverse scar from previous cesarean delivery: Secondary | ICD-10-CM | POA: Diagnosis not present

## 2021-02-09 DIAGNOSIS — O34219 Maternal care for unspecified type scar from previous cesarean delivery: Secondary | ICD-10-CM | POA: Diagnosis present

## 2021-02-09 DIAGNOSIS — D509 Iron deficiency anemia, unspecified: Secondary | ICD-10-CM | POA: Diagnosis present

## 2021-02-09 DIAGNOSIS — Z3A40 40 weeks gestation of pregnancy: Secondary | ICD-10-CM | POA: Diagnosis not present

## 2021-02-09 DIAGNOSIS — O26893 Other specified pregnancy related conditions, third trimester: Secondary | ICD-10-CM | POA: Diagnosis present

## 2021-02-09 HISTORY — DX: Other complications of anesthesia, initial encounter: T88.59XA

## 2021-02-09 LAB — RESP PANEL BY RT-PCR (FLU A&B, COVID) ARPGX2
Influenza A by PCR: NEGATIVE
Influenza B by PCR: NEGATIVE
SARS Coronavirus 2 by RT PCR: NEGATIVE

## 2021-02-09 LAB — CBC
HCT: 38 % (ref 36.0–46.0)
Hemoglobin: 12.6 g/dL (ref 12.0–15.0)
MCH: 29.3 pg (ref 26.0–34.0)
MCHC: 33.2 g/dL (ref 30.0–36.0)
MCV: 88.4 fL (ref 80.0–100.0)
Platelets: 184 10*3/uL (ref 150–400)
RBC: 4.3 MIL/uL (ref 3.87–5.11)
RDW: 17.2 % — ABNORMAL HIGH (ref 11.5–15.5)
WBC: 7.3 10*3/uL (ref 4.0–10.5)
nRBC: 0 % (ref 0.0–0.2)

## 2021-02-09 LAB — TYPE AND SCREEN
ABO/RH(D): O POS
Antibody Screen: NEGATIVE

## 2021-02-09 LAB — RPR: RPR Ser Ql: NONREACTIVE

## 2021-02-09 MED ORDER — LACTATED RINGERS IV SOLN: INTRAVENOUS | Status: DC

## 2021-02-09 MED ORDER — SODIUM CHLORIDE 0.9% FLUSH: 3.0000 mL | INTRAVENOUS | Status: DC | PRN

## 2021-02-09 MED ORDER — EPHEDRINE 5 MG/ML INJ: 10.0000 mg | INTRAVENOUS | Status: DC | PRN

## 2021-02-09 MED ORDER — DIPHENHYDRAMINE HCL 25 MG PO CAPS: 25.0000 mg | ORAL_CAPSULE | Freq: Four times a day (QID) | ORAL | Status: DC | PRN

## 2021-02-09 MED ORDER — METHYLERGONOVINE MALEATE 0.2 MG/ML IJ SOLN: 0.2000 mg | INTRAMUSCULAR | Status: DC | PRN

## 2021-02-09 MED ORDER — ACETAMINOPHEN 325 MG PO TABS: 650.0000 mg | ORAL_TABLET | ORAL | Status: DC | PRN

## 2021-02-09 MED ORDER — OXYCODONE-ACETAMINOPHEN 5-325 MG PO TABS
2.0000 | ORAL_TABLET | ORAL | Status: DC | PRN
Start: 2021-02-09 — End: 2021-02-09

## 2021-02-09 MED ORDER — FERROUS SULFATE 325 (65 FE) MG PO TABS
325.0000 mg | ORAL_TABLET | Freq: Two times a day (BID) | ORAL | Status: DC
  Administered 2021-02-09 – 2021-02-10 (×4): 325 mg via ORAL
  Filled 2021-02-09 (×4): qty 1

## 2021-02-09 MED ORDER — ACETAMINOPHEN 325 MG PO TABS
650.0000 mg | ORAL_TABLET | ORAL | Status: DC | PRN
  Administered 2021-02-09 – 2021-02-10 (×3): 650 mg via ORAL
  Filled 2021-02-09 (×3): qty 2

## 2021-02-09 MED ORDER — ONDANSETRON HCL 4 MG/2ML IJ SOLN: 4.0000 mg | INTRAMUSCULAR | Status: DC | PRN

## 2021-02-09 MED ORDER — LACTATED RINGERS IV SOLN: 500.0000 mL | INTRAVENOUS | Status: DC | PRN

## 2021-02-09 MED ORDER — BENZOCAINE-MENTHOL 20-0.5 % EX AERO: 1.0000 "application " | INHALATION_SPRAY | CUTANEOUS | Status: DC | PRN

## 2021-02-09 MED ORDER — OXYTOCIN-SODIUM CHLORIDE 30-0.9 UT/500ML-% IV SOLN
INTRAVENOUS | Status: AC
  Administered 2021-02-09: 333 mL via INTRAVENOUS
  Filled 2021-02-09: qty 500

## 2021-02-09 MED ORDER — TETANUS-DIPHTH-ACELL PERTUSSIS 5-2.5-18.5 LF-MCG/0.5 IM SUSY: 0.5000 mL | PREFILLED_SYRINGE | Freq: Once | INTRAMUSCULAR | Status: DC

## 2021-02-09 MED ORDER — MEASLES, MUMPS & RUBELLA VAC IJ SOLR: 0.5000 mL | Freq: Once | INTRAMUSCULAR | Status: DC

## 2021-02-09 MED ORDER — SIMETHICONE 80 MG PO CHEW: 80.0000 mg | CHEWABLE_TABLET | ORAL | Status: DC | PRN

## 2021-02-09 MED ORDER — COCONUT OIL OIL
1.0000 "application " | TOPICAL_OIL | Status: DC | PRN
  Administered 2021-02-10: 1 via TOPICAL

## 2021-02-09 MED ORDER — OXYCODONE-ACETAMINOPHEN 5-325 MG PO TABS: 1.0000 | ORAL_TABLET | ORAL | Status: DC | PRN

## 2021-02-09 MED ORDER — SODIUM CHLORIDE 0.9 % IV SOLN
2.0000 g | Freq: Once | INTRAVENOUS | Status: AC
  Administered 2021-02-09: 2 g via INTRAVENOUS
  Filled 2021-02-09: qty 2000

## 2021-02-09 MED ORDER — FLEET ENEMA 7-19 GM/118ML RE ENEM: 1.0000 | ENEMA | RECTAL | Status: DC | PRN

## 2021-02-09 MED ORDER — LACTATED RINGERS IV SOLN: 500.0000 mL | Freq: Once | INTRAVENOUS | Status: DC

## 2021-02-09 MED ORDER — PHENYLEPHRINE 40 MCG/ML (10ML) SYRINGE FOR IV PUSH (FOR BLOOD PRESSURE SUPPORT): 80.0000 ug | PREFILLED_SYRINGE | INTRAVENOUS | Status: DC | PRN

## 2021-02-09 MED ORDER — PRENATAL MULTIVITAMIN CH
1.0000 | ORAL_TABLET | Freq: Every day | ORAL | Status: DC
  Administered 2021-02-09 – 2021-02-10 (×2): 1 via ORAL
  Filled 2021-02-09 (×2): qty 1

## 2021-02-09 MED ORDER — OXYTOCIN BOLUS FROM INFUSION: 333.0000 mL | Freq: Once | INTRAVENOUS | Status: AC

## 2021-02-09 MED ORDER — SODIUM CHLORIDE 0.9 % IV SOLN: 1.0000 g | INTRAVENOUS | Status: DC

## 2021-02-09 MED ORDER — SODIUM CHLORIDE 0.9% FLUSH: 3.0000 mL | Freq: Two times a day (BID) | INTRAVENOUS | Status: DC

## 2021-02-09 MED ORDER — MAGNESIUM HYDROXIDE 400 MG/5ML PO SUSP: 30.0000 mL | ORAL | Status: DC | PRN

## 2021-02-09 MED ORDER — DIPHENHYDRAMINE HCL 50 MG/ML IJ SOLN: 12.5000 mg | INTRAMUSCULAR | Status: DC | PRN

## 2021-02-09 MED ORDER — ONDANSETRON HCL 4 MG/2ML IJ SOLN: 4.0000 mg | Freq: Four times a day (QID) | INTRAMUSCULAR | Status: DC | PRN

## 2021-02-09 MED ORDER — ZOLPIDEM TARTRATE 5 MG PO TABS: 5.0000 mg | ORAL_TABLET | Freq: Every evening | ORAL | Status: DC | PRN

## 2021-02-09 MED ORDER — LIDOCAINE HCL (PF) 1 % IJ SOLN
30.0000 mL | INTRAMUSCULAR | Status: DC | PRN
Start: 2021-02-09 — End: 2021-02-09

## 2021-02-09 MED ORDER — SODIUM CHLORIDE 0.9 % IV SOLN: 250.0000 mL | INTRAVENOUS | Status: DC | PRN

## 2021-02-09 MED ORDER — SENNOSIDES-DOCUSATE SODIUM 8.6-50 MG PO TABS
2.0000 | ORAL_TABLET | Freq: Every day | ORAL | Status: DC
  Administered 2021-02-10: 2 via ORAL
  Filled 2021-02-09: qty 2

## 2021-02-09 MED ORDER — OXYTOCIN-SODIUM CHLORIDE 30-0.9 UT/500ML-% IV SOLN
2.5000 [IU]/h | INTRAVENOUS | Status: DC
  Administered 2021-02-09: 2.5 [IU]/h via INTRAVENOUS

## 2021-02-09 MED ORDER — DIBUCAINE (PERIANAL) 1 % EX OINT: 1.0000 "application " | TOPICAL_OINTMENT | CUTANEOUS | Status: DC | PRN

## 2021-02-09 MED ORDER — WITCH HAZEL-GLYCERIN EX PADS: 1.0000 "application " | MEDICATED_PAD | CUTANEOUS | Status: DC | PRN

## 2021-02-09 MED ORDER — OXYCODONE-ACETAMINOPHEN 5-325 MG PO TABS
2.0000 | ORAL_TABLET | ORAL | Status: DC | PRN
  Administered 2021-02-09: 2 via ORAL
  Filled 2021-02-09: qty 2

## 2021-02-09 MED ORDER — FENTANYL-BUPIVACAINE-NACL 0.5-0.125-0.9 MG/250ML-% EP SOLN: 12.0000 mL/h | EPIDURAL | Status: DC | PRN

## 2021-02-09 MED ORDER — METHYLERGONOVINE MALEATE 0.2 MG PO TABS: 0.2000 mg | ORAL_TABLET | ORAL | Status: DC | PRN

## 2021-02-09 MED ORDER — IBUPROFEN 800 MG PO TABS
800.0000 mg | ORAL_TABLET | Freq: Three times a day (TID) | ORAL | Status: DC
  Administered 2021-02-09 – 2021-02-11 (×6): 800 mg via ORAL
  Filled 2021-02-09 (×6): qty 1

## 2021-02-09 MED ORDER — ONDANSETRON HCL 4 MG PO TABS: 4.0000 mg | ORAL_TABLET | ORAL | Status: DC | PRN

## 2021-02-09 MED ORDER — SOD CITRATE-CITRIC ACID 500-334 MG/5ML PO SOLN: 30.0000 mL | ORAL | Status: DC | PRN

## 2021-02-09 MED ORDER — FENTANYL-BUPIVACAINE-NACL 0.5-0.125-0.9 MG/250ML-% EP SOLN
EPIDURAL | Status: AC
  Filled 2021-02-09: qty 250

## 2021-02-09 NOTE — H&P (Addendum)
30 y.o. [redacted]w[redacted]d  [id number] comes in c/o labor.  Otherwise has good fetal movement and no bleeding.  Past Medical History:  Diagnosis Date   Anemia    previous pregnancy    Blood transfusion without reported diagnosis     Past Surgical History:  Procedure Laterality Date   CESAREAN SECTION N/A 09/14/2017   Procedure: CESAREAN SECTION;  Surgeon: Olga Millers, MD;  Location: [place];  Service: Obstetrics;  Laterality: N/A;    OB History  Gravida Para Term Preterm AB Living  8 4 4  0 2 4  SAB IAB Ectopic Multiple Live Births  1 1   0 4    # Outcome Date GA Lbr Len/2nd Weight Sex Delivery Anes PTL Lv  8 Current           7 Term 09/14/17 [redacted]w[redacted]d  3345 g M CS-LTranv EPI  LIV     Birth Comments: extra digit bilaterally     Complications: Fetal Intolerance  6 Gravida           5 IAB           4 SAB           3 Term     M Vag-Spont   LIV  2 Term     F Vag-Spont  N LIV  1 Term     F Vag-Spont  N LIV    Social History   Socioeconomic History   Marital status: Single    Spouse name: Not on file   Number of children: Not on file   Years of education: Not on file   Highest education level: Not on file  Occupational History   Not on file  Tobacco Use   Smoking status: Never   Smokeless tobacco: Never  Vaping Use   Vaping Use: Never used  Substance and Sexual Activity   Alcohol use: Not Currently   Drug use: Not Currently    Types: Marijuana    Comment: stopped with +pt   Sexual activity: Yes    Birth control/protection: None  Other Topics Concern   Not on file  Social History Narrative   Not on file   Social Determinants of Health   Financial Resource Strain: Not on file  Food Insecurity: Not on file  Transportation Needs: Not on file  Physical Activity: Not on file  Stress: Not on file  Social Connections: Not on file  Intimate Partner Violence: Not on file   Patient has no known allergies.    Prenatal Transfer Tool  Maternal Diabetes: No Genetic  Screening: Declined Maternal Ultrasounds/Referrals: Normal Fetal Ultrasounds or other Referrals:  None Maternal Substance Abuse:  Yes:  Type: Marijuana Significant Maternal Medications:  None Significant Maternal Lab Results: Other: mild anemia  Other PNC: uncomplicated but only seen once in last month    There were no vitals filed for this visit.  Lungs/Cor:  NAD Abdomen:  soft, gravid Ex:  no cords, erythema SVE:  7/80./-2 FHTs:  140s, good STV, too short for NST yet Toco:  q 7   A/P   Term labor for VBAC (prior 3 vaginal deliveries)  GBS just done yesterday secondary not seen in last month.  Amp.    Daria Pastures

## 2021-02-09 NOTE — MAU Note (Signed)
Received 30 y/o, [id number] , 40 1/7  weeks, bypass triage, pt c/o ctx x1hr, pt reported ctx are every 2 mins apart, cervical exam performed 7/90/-2, iv started and blood drawn, pt denies any vaginal bleeding or leakage of fluid, + FM, support person at bedside, safety maintained.

## 2021-02-10 LAB — CBC
HCT: 34.6 % — ABNORMAL LOW (ref 36.0–46.0)
Hemoglobin: 11.6 g/dL — ABNORMAL LOW (ref 12.0–15.0)
MCH: 29.1 pg (ref 26.0–34.0)
MCHC: 33.5 g/dL (ref 30.0–36.0)
MCV: 86.9 fL (ref 80.0–100.0)
Platelets: 159 10*3/uL (ref 150–400)
RBC: 3.98 MIL/uL (ref 3.87–5.11)
RDW: 16.8 % — ABNORMAL HIGH (ref 11.5–15.5)
WBC: 8 10*3/uL (ref 4.0–10.5)
nRBC: 0 % (ref 0.0–0.2)

## 2021-02-10 MED ORDER — SUCROSE 24% NICU/PEDS ORAL SOLUTION: 0.5000 mL | OROMUCOSAL | Status: DC | PRN

## 2021-02-10 MED ORDER — ACETAMINOPHEN FOR CIRCUMCISION 160 MG/5 ML: 40.0000 mg | Freq: Once | ORAL | Status: DC

## 2021-02-10 MED ORDER — WHITE PETROLATUM EX OINT: 1.0000 "application " | TOPICAL_OINTMENT | CUTANEOUS | Status: DC | PRN

## 2021-02-10 MED ORDER — EPINEPHRINE TOPICAL FOR CIRCUMCISION 0.1 MG/ML: 1.0000 [drp] | TOPICAL | Status: DC | PRN

## 2021-02-10 MED ORDER — LIDOCAINE 1% INJECTION FOR CIRCUMCISION
0.8000 mL | INJECTION | Freq: Once | INTRAVENOUS | Status: DC
  Filled 2021-02-10: qty 1

## 2021-02-10 MED ORDER — ACETAMINOPHEN FOR CIRCUMCISION 160 MG/5 ML: 40.0000 mg | ORAL | Status: DC | PRN

## 2021-02-10 NOTE — Procedures (Deleted)
Circumcision was performed after 1% of buffered lidocaine was administered in a dorsal penile block.   Gomco 1.3 was used.   Normal anatomy was seen and hemostasis was achieved.   MRN and consent were checked prior to procedure.   All risks were discussed with the baby's mother.   The foreskin was removed and disposed of according to hospital policy.

## 2021-02-10 NOTE — Discharge Summary (Addendum)
Postpartum Discharge Summary     Patient Name: Kimberly Morris DOB: 1990-11-05 MRN: [id number]  Date of admission: 02/09/2021 Delivery date:02/09/2021  Delivering provider: Bobbye Charleston  Date of discharge: 02/11/2021  Admitting diagnosis: Indication for care in labor or delivery [O75.9] Intrauterine pregnancy: [redacted]w[redacted]d     Secondary diagnosis:  Active Problems:   Indication for care in labor or delivery  Additional problems: prior C/S with prior successful VBACs    Discharge diagnosis: Term Pregnancy Delivered                                              Post partum procedures: none Augmentation: N/A Complications: None  Hospital course: Onset of Labor With Vaginal Delivery      30 y.o. yo [id number] at [redacted]w[redacted]d was admitted in Active Labor on 02/09/2021. Patient had an uncomplicated labor course as follows:  Membrane Rupture Time/Date: 3:49 AM ,02/09/2021   Delivery Method:Vaginal, Spontaneous  Episiotomy: None  Lacerations:  None  Patient had an uncomplicated postpartum course.  She is ambulating, tolerating a regular diet, passing flatus, and urinating well. Patient is discharged home in stable condition on 02/11/21.  Newborn Data: Birth date:02/09/2021  Birth time:4:52 AM  Gender:Female  Living status:Living  Apgars:9 ,9  Weight:3650 g   Magnesium Sulfate received: No BMZ received: No Rhophylac:N/A MMR:N/A T-DaP: see office note Flu: N/A Transfusion:No  Physical exam  Vitals:   02/09/21 1930 02/10/21 0656 02/10/21 2048 02/11/21 0500  BP: (!) 97/53 100/72 (!) 100/58 112/60  Pulse: 81 77 81 79  Resp: $Remo'18 18 16 18  'udPkj$ Temp: 98.7 F (37.1 C) 98.5 F (36.9 C) 98.8 F (37.1 C) 98.2 F (36.8 C)  TempSrc: Oral Oral Oral Oral  SpO2: 99% 99% 100% 100%  Weight:      Height:       General: alert, cooperative, and no distress Lochia: appropriate Uterine Fundus: firm Incision: N/A DVT Evaluation: No evidence of DVT seen on physical exam. Labs: Lab Results  Component Value  Date   WBC 8.0 02/10/2021   HGB 11.6 (L) 02/10/2021   HCT 34.6 (L) 02/10/2021   MCV 86.9 02/10/2021   PLT 159 02/10/2021   CMP Latest Ref Rng & Units 01/09/2017  Glucose 65 - 99 mg/dL 116(H)  BUN 6 - 20 mg/dL 6  Creatinine 0.44 - 1.00 mg/dL 0.64  Sodium 135 - 145 mmol/L 139  Potassium 3.5 - 5.1 mmol/L 3.3(L)  Chloride 101 - 111 mmol/L 108  CO2 22 - 32 mmol/L 25  Calcium 8.9 - 10.3 mg/dL 9.1  Total Protein 6.5 - 8.1 g/dL 6.9  Total Bilirubin 0.3 - 1.2 mg/dL 0.5  Alkaline Phos 38 - 126 U/L 46  AST 15 - 41 U/L 20  ALT 14 - 54 U/L 14   Edinburgh Score: Edinburgh Postnatal Depression Scale Screening Tool 02/10/2021  I have been able to laugh and see the funny side of things. 0  I have looked forward with enjoyment to things. 0  I have blamed myself unnecessarily when things went wrong. 0  I have been anxious or worried for no good reason. 1  I have felt scared or panicky for no good reason. 0  Things have been getting on top of me. 1  I have been so unhappy that I have had difficulty sleeping. 0  I have felt sad or miserable. 0  I have been so unhappy that I have been crying. 0  The thought of harming myself has occurred to me. 0  Edinburgh Postnatal Depression Scale Total 2      After visit meds:  Allergies as of 02/11/2021   No Known Allergies      Medication List     STOP taking these medications    cyclobenzaprine 10 MG tablet Commonly known as: FLEXERIL       TAKE these medications    prenatal multivitamin Tabs tablet Take 1 tablet by mouth daily at 12 noon.         Discharge home in stable condition Infant Feeding:  see peds note Infant Disposition:home with mother Discharge instruction: per After Visit Summary and Postpartum booklet. Activity: Advance as tolerated. Pelvic rest for 6 weeks.  Diet: routine diet Anticipated Birth Control: Unsure Postpartum Appointment:4 weeks Additional Postpartum F/U: Postpartum Depression checkup Future  Appointments:No future appointments. Follow up Visit:  Follow-up Information     Bobbye Charleston, MD Follow up in 4 week(s).   Specialty: Obstetrics and Gynecology Contact information: [address] [place] [id number] [phone]                     02/11/2021 Logan Bores, MD

## 2021-02-11 NOTE — Progress Notes (Signed)
Post Partum Day 2 Subjective: no complaints and up ad lib  Had to stay additional day for baby bilirubin levels, now fine  Objective: Blood pressure 112/60, pulse 79, temperature 98.2 F (36.8 C), temperature source Oral, resp. rate 18, height 5\' 4"  (1.626 m), weight 79.8 kg, last menstrual period 05/04/2020, SpO2 100 %, unknown if currently breastfeeding.  Physical Exam:  General: alert and cooperative Lochia: appropriate Uterine Fundus: firm   Recent Labs    02/09/21 0410 02/10/21 0451  HGB 12.6 11.6*  HCT 38.0 34.6*    Assessment/Plan: Discharge home   LOS: 2 days   Logan Bores 02/11/2021, 9:40 AM

## 2021-02-13 ENCOUNTER — Inpatient Hospital Stay (HOSPITAL_COMMUNITY): Admission: AD | Admit: 2021-02-13 | Payer: Medicaid Other | Source: Home / Self Care | Admitting: Obstetrics

## 2021-02-13 ENCOUNTER — Inpatient Hospital Stay (HOSPITAL_COMMUNITY): Payer: Medicaid Other

## 2021-02-23 ENCOUNTER — Telehealth (HOSPITAL_COMMUNITY): Payer: Self-pay

## 2021-02-23 NOTE — Telephone Encounter (Signed)
"  Doing fine. Feeling fine." Patient has no questions or concerns about her healing.  "He is getting big fast. Eating,peeing, pooping, sleeping, and repeat. We went to the pediatrician the other day. His cord has fallen off and they said it looked ok. This is baby #5 for me. He sleeps in a bassinet."  RN reviewed ABC's of safe sleep with patient. Patient declines any questions or concerns about baby.  EPDS score is 1.  Sharyn Lull [facility] 02/23/2021,1618

## 2021-03-06 IMAGING — US OBSTETRIC <14 WK US AND TRANSVAGINAL OB US
1 series · 14 of 28 positions shown · non-contrast
Comparison: None.

CLINICAL DATA: 28-year-old pregnant female presents with vaginal
bleeding and abdominal pain. Quantitative beta HCG pending.

EDC by LMP: 03/12/2019, projecting to an expected gestational age of
10 weeks 5 days.
EXAM:
OBSTETRIC <14 WK US AND TRANSVAGINAL OB US
TECHNIQUE: Both transabdominal and transvaginal ultrasound examinations were
performed for complete evaluation of the gestation as well as the
maternal uterus, adnexal regions, and pelvic cul-de-sac.
Transvaginal technique was performed to assess early pregnancy.

[Series 1: obstetric <14 wk us and transvaginal ob us · 14 of 60 slices shown]
[im 3/60]
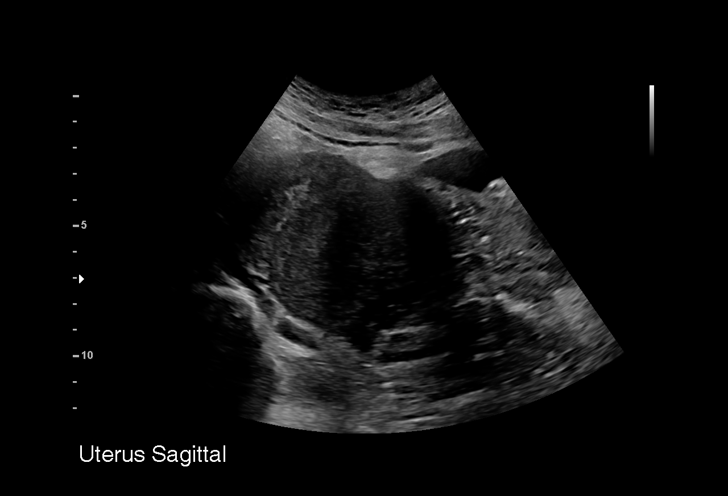
[im 7/60]
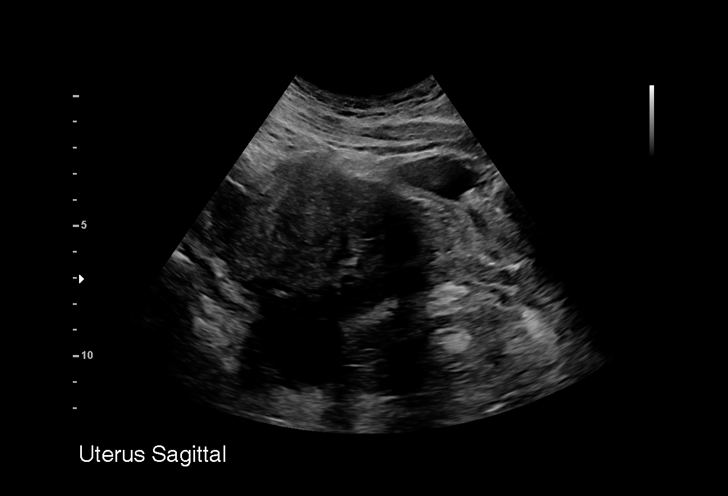
[im 11/60]
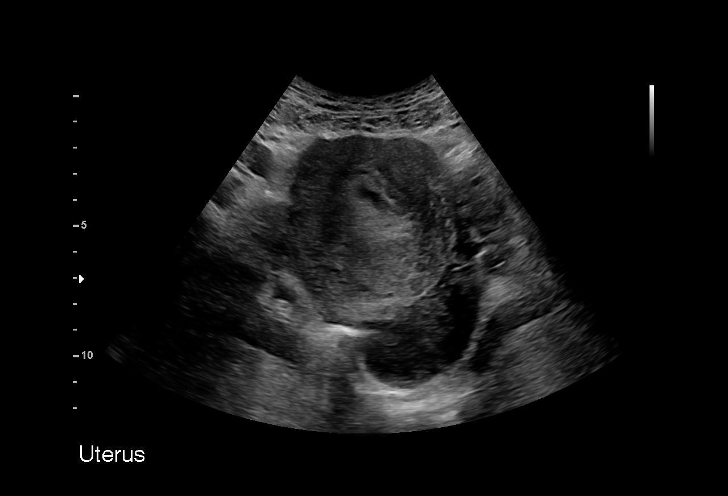
[im 16/60]
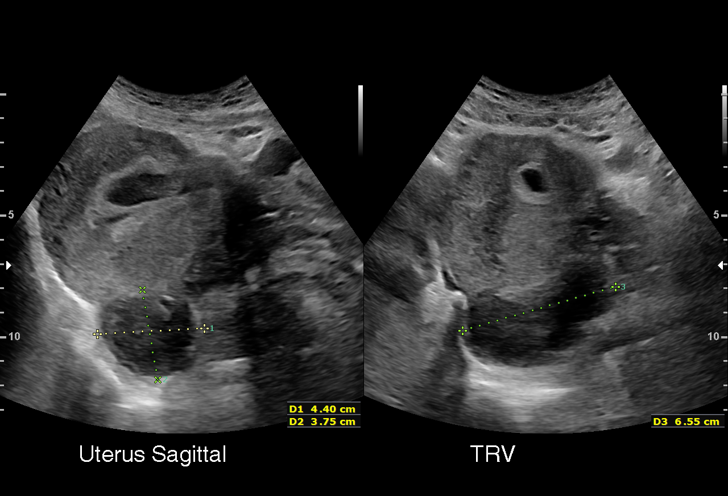
[im 20/60]
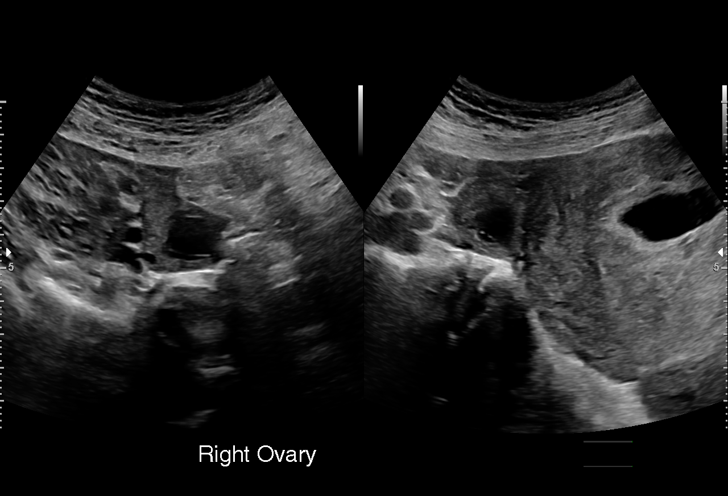
[im 25/60]
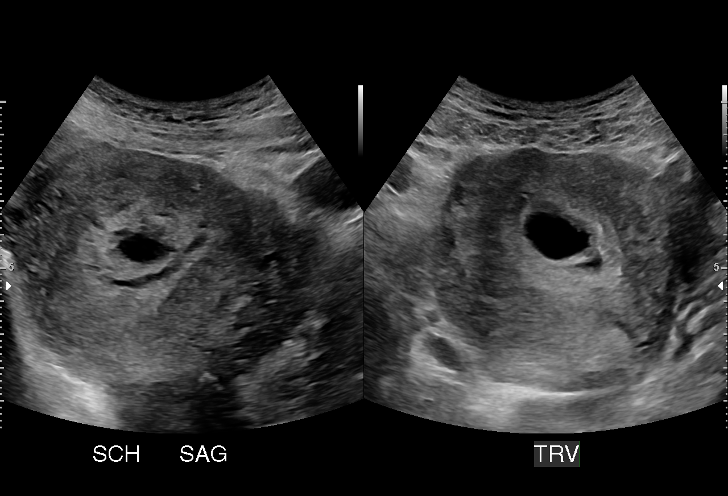
[im 29/60]
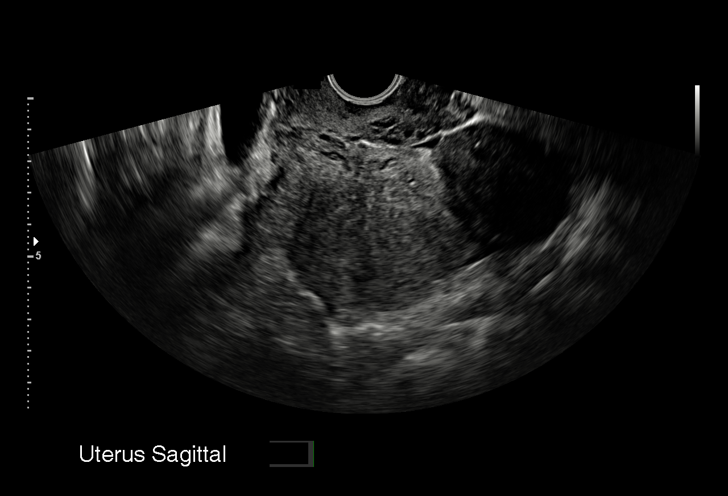
[im 33/60]
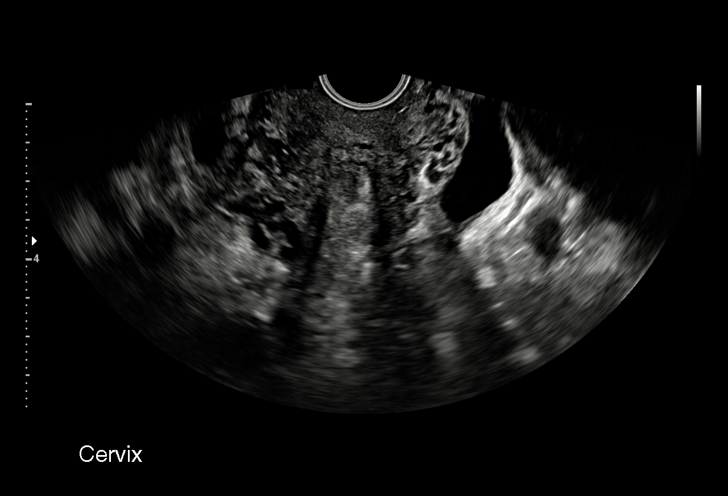
[im 38/60]
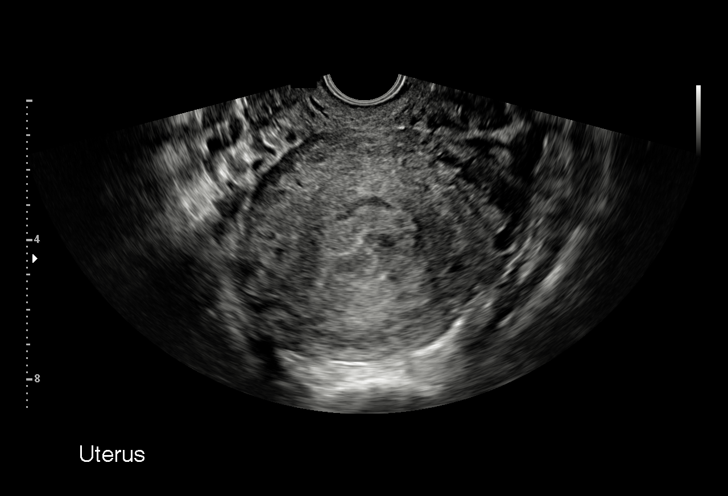
[im 42/60]
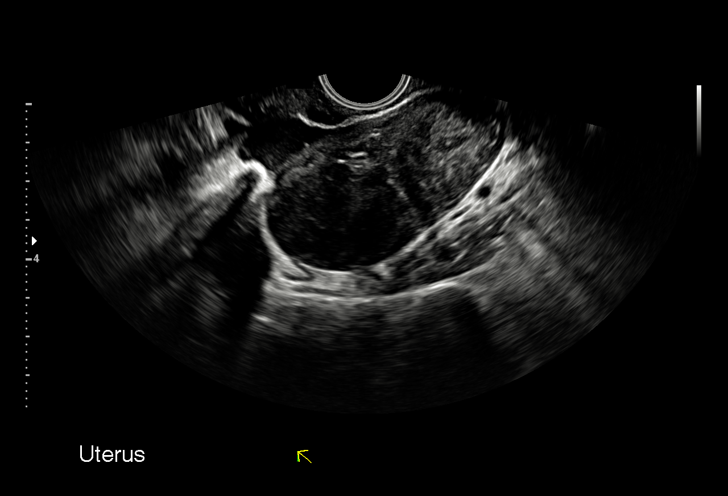
[im 46/60]
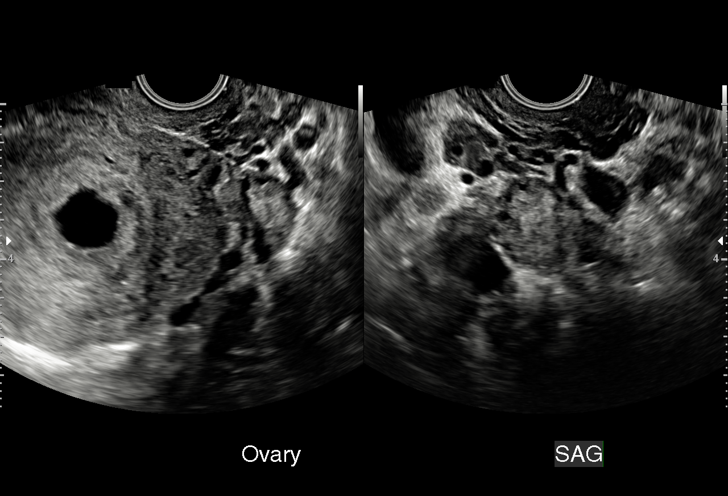
[im 51/60]
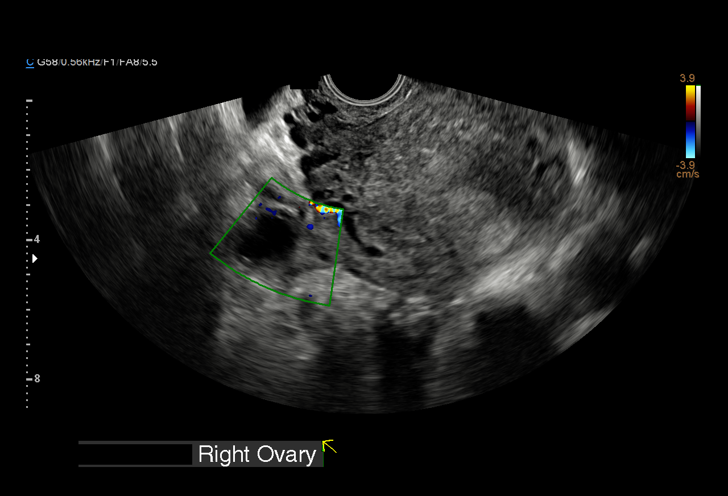
[im 55/60]
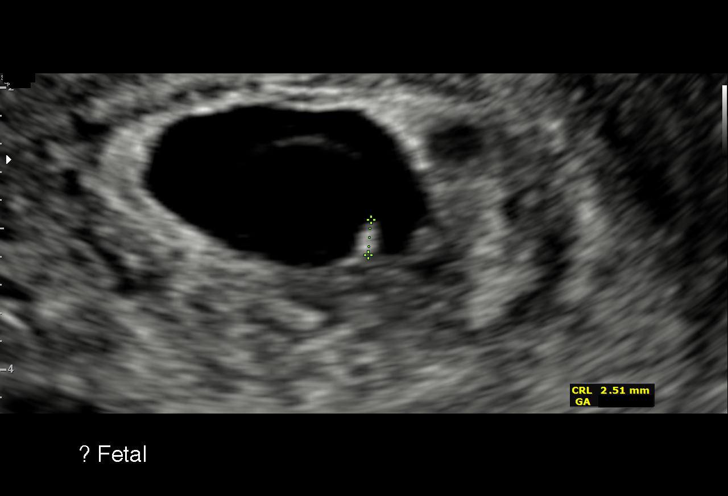
[im 60/60]
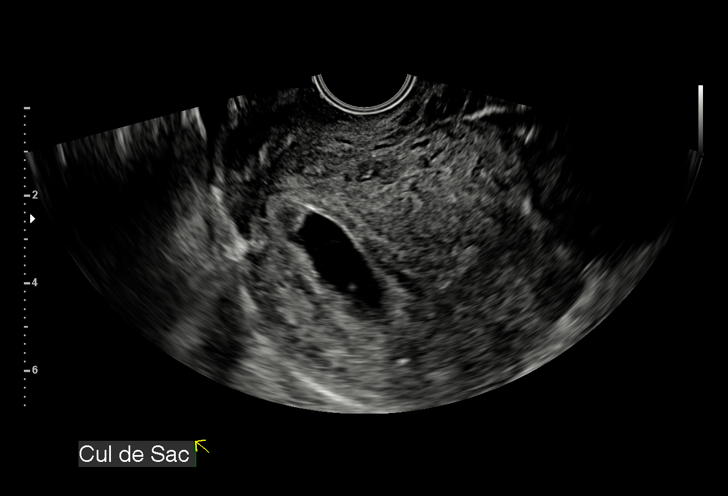

[14 of 28 positions shown; findings below may reference images not displayed]

FINDINGS: Intrauterine gestational sac: Single intrauterine gestational sac
with irregular contour.

Yolk sac: Thin-walled 9 mm round cystic structure within the
gestational sac may represent an abnormally large yolk sac.

Embryo:  Tiny suspected embryo.

Embryonic Cardiac Activity: Not Visualized.

CRL:  2.5 mm   5 w   5 d                  US EDC: 04/12/2019

Subchorionic hemorrhage: Small perigestational bleed in the lower
cavity involving less than 30% of the gestational sac circumference.

Maternal uterus/adnexae: Retroverted uterus. Left posterior uterine
body subserosal 4.4 x 3.8 x 6.6 cm fibroid. Left ovary measures
x 1.3 cm. Right ovary measures 3.8 x 2.3 x 2.8 cm. No abnormal
ovarian or adnexal masses. No abnormal free fluid in the pelvis.
IMPRESSION: 1. Single intrauterine gestation at 5 weeks 5 days by crown-rump
length, discordant with expected gestational age of 10 weeks 5 days
by provided menstrual dating. No embryonic cardiac activity
detected. Suspected abnormally large yolk sac. Irregular gestational
sac contour. Small perigestational bleed. These findings are poor
prognostic factors. Findings are suspicious but not yet definitive
for pregnancy demise. Recommend follow-up US in 11-14 days for
definitive diagnosis. This recommendation follows SRU consensus
guidelines: Diagnostic Criteria for Nonviable Pregnancy Early in the
First Trimester. N Engl J Med 4146; [DATE].
2. Large 6.6 cm subserosal left posterior uterine body fibroid.
3. No ovarian or adnexal abnormality.  No free pelvic fluid.

## 2021-03-07 IMAGING — US TRANSVAGINAL OB ULTRASOUND
1 series · 15 of 28 positions shown · non-contrast
Comparison: Ob ultrasound 08/19/2018.

CLINICAL DATA: Pregnant patient with vaginal bleeding.

EXAM:
TRANSVAGINAL OB ULTRASOUND
TECHNIQUE: Transvaginal ultrasound was performed for complete evaluation of the
gestation as well as the maternal uterus, adnexal regions, and
pelvic cul-de-sac.

[Series 1: transvaginal ob ultrasound · 15 of 31 slices shown]
[im 1/31]
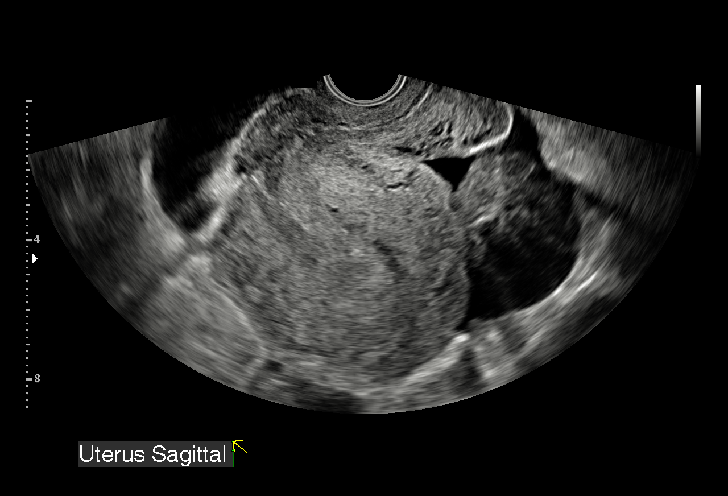
[im 3/31]
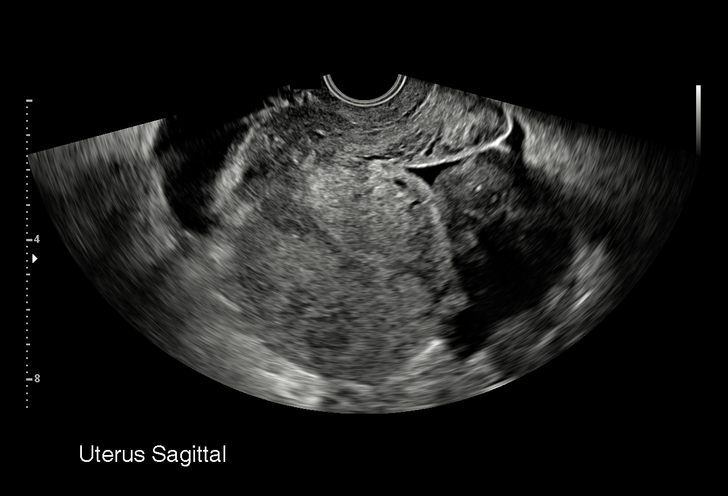
[im 5/31]
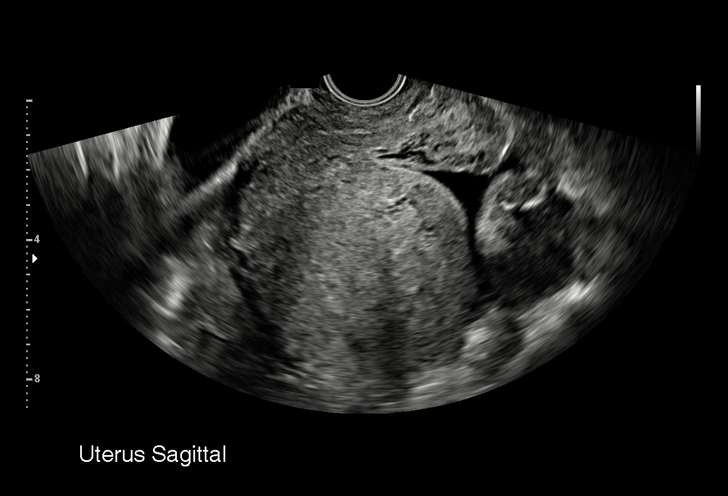
[im 7/31]
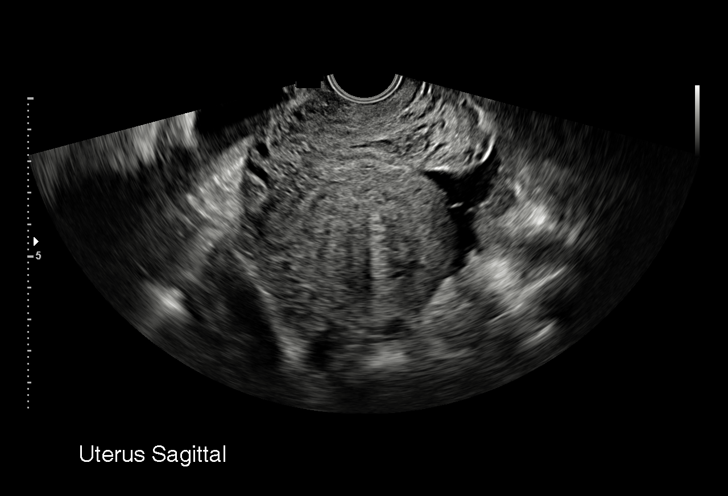
[im 9/31]
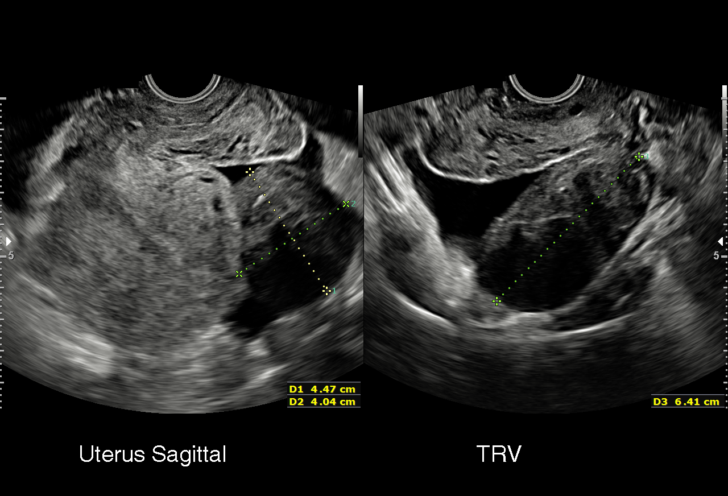
[im 12/31]
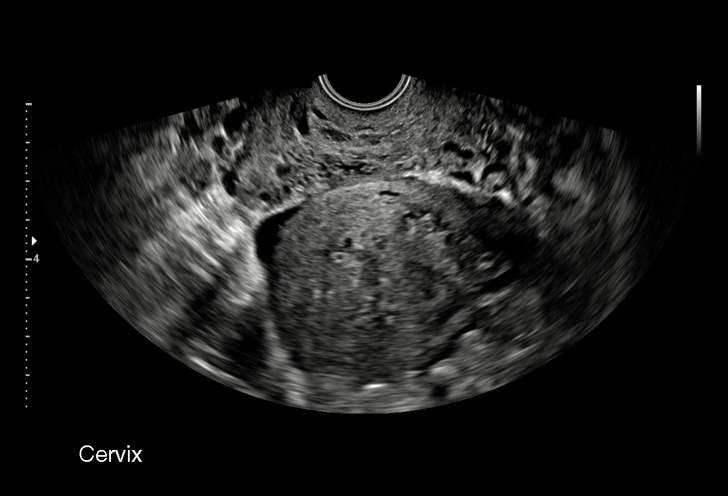
[im 14/31]
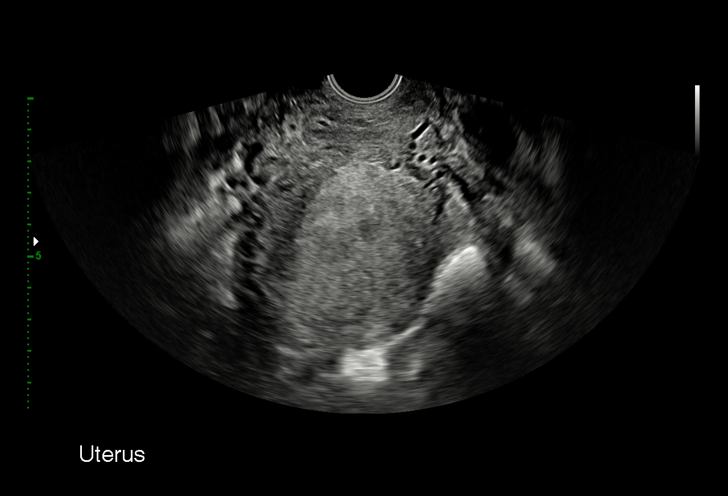
[im 16/31]
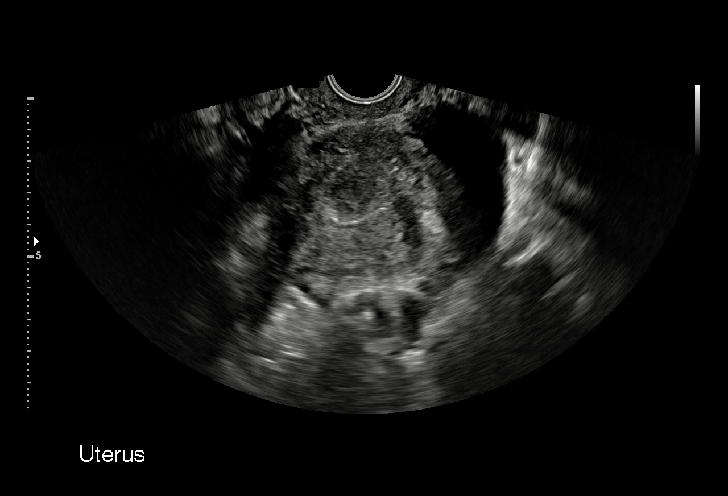
[im 17/31]
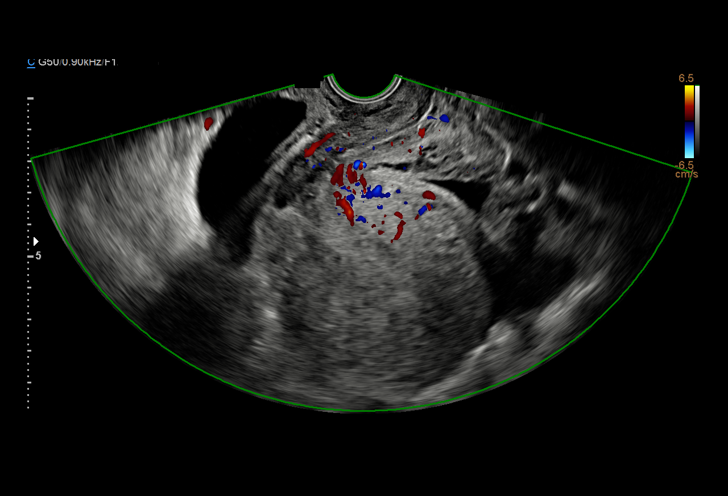
[im 19/31]
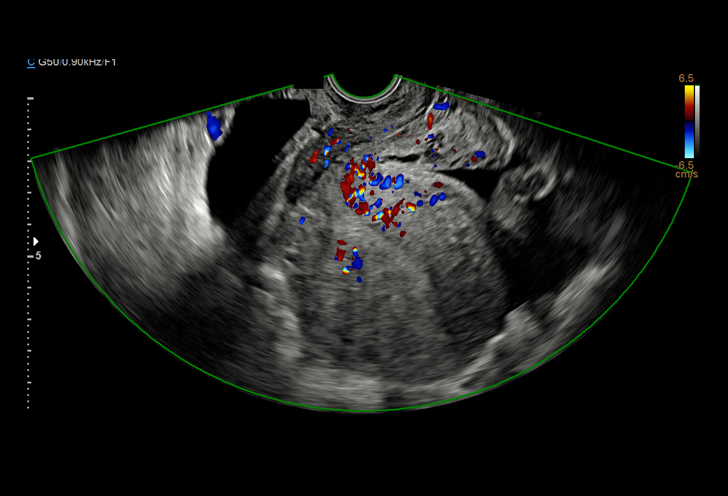
[im 22/31]
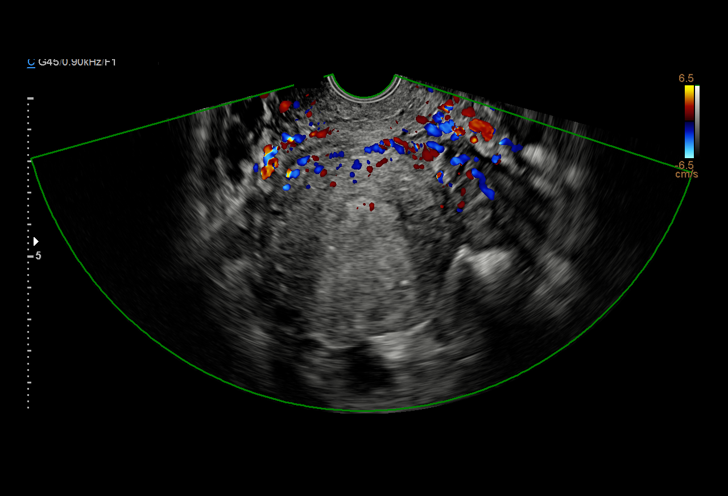
[im 24/31]
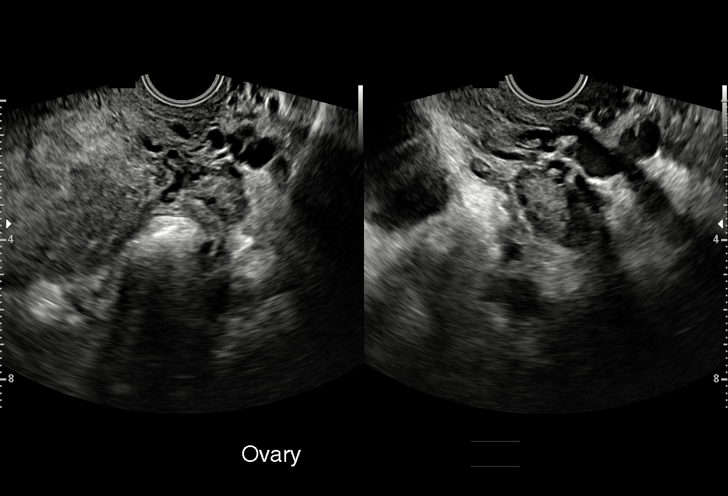
[im 26/31]
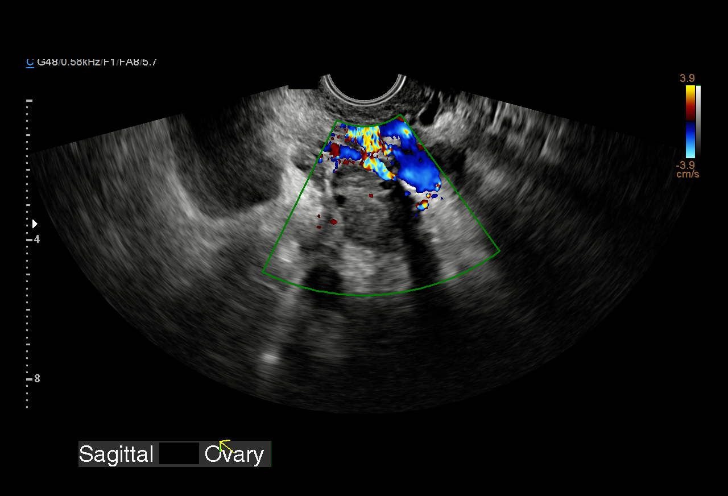
[im 28/31]
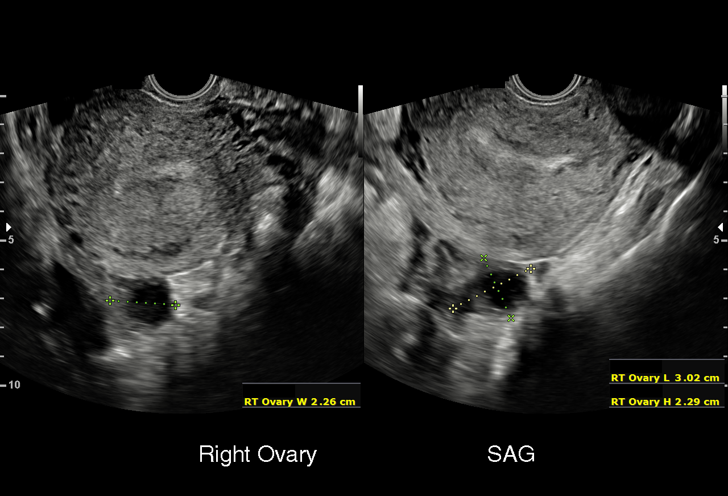
[im 31/31]
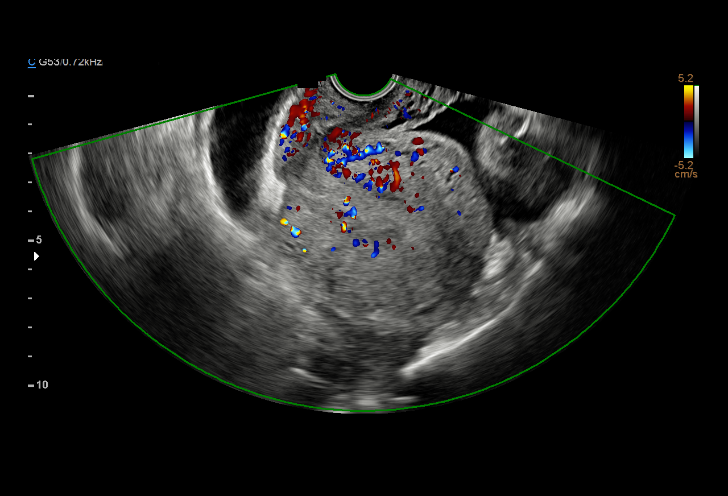

[15 of 28 positions shown; findings below may reference images not displayed]

FINDINGS: Intrauterine gestational sac: Gestational sac seen on yesterday's
examination is no longer present. The endometrium is heterogeneous
and thickened at 2.5 cm. There is flow in the endometrium on Doppler
imaging. 4.5 cm fibroid noted.

Yolk sac:  Not visualized.

Embryo:  Not visualized.

Cardiac Activity: Not applicable.

Subchorionic hemorrhage:  Not applicable.

Maternal uterus/adnexae: Appear normal. Trace amount of free pelvic
fluid noted.
IMPRESSION: Intrauterine gestational sac seen on yesterday's examination is no
longer present. Thickening of the endometrium with flow on Doppler
imaging is consistent presence of products of conception and likely
abortion in progress given vaginal bleeding.

## 2023-03-22 ENCOUNTER — Encounter (HOSPITAL_BASED_OUTPATIENT_CLINIC_OR_DEPARTMENT_OTHER): Payer: Self-pay | Admitting: Emergency Medicine

## 2023-03-22 ENCOUNTER — Emergency Department (HOSPITAL_BASED_OUTPATIENT_CLINIC_OR_DEPARTMENT_OTHER)
Admission: EM | Admit: 2023-03-22 | Discharge: 2023-03-22 | Disposition: A | Discharge status: Home / Self Care | Payer: Medicaid Other

## 2023-03-22 DIAGNOSIS — K0889 Other specified disorders of teeth and supporting structures: Secondary | ICD-10-CM | POA: Insufficient documentation

## 2023-03-22 DIAGNOSIS — E876 Hypokalemia: Secondary | ICD-10-CM | POA: Diagnosis not present

## 2023-03-22 DIAGNOSIS — D649 Anemia, unspecified: Secondary | ICD-10-CM | POA: Insufficient documentation

## 2023-03-22 LAB — COMPREHENSIVE METABOLIC PANEL
ALT: 10 U/L (ref 0–44)
AST: 14 U/L — ABNORMAL LOW (ref 15–41)
Albumin: 3.8 g/dL (ref 3.5–5.0)
Alkaline Phosphatase: 55 U/L (ref 38–126)
Anion gap: 7 (ref 5–15)
BUN: <5 mg/dL — ABNORMAL LOW (ref 6–20)
CO2: 24 mmol/L (ref 22–32)
Calcium: 8.8 mg/dL — ABNORMAL LOW (ref 8.9–10.3)
Chloride: 103 mmol/L (ref 98–111)
Creatinine, Ser: 0.74 mg/dL (ref 0.44–1.00)
GFR, Estimated: >60 mL/min (ref >60)
Glucose, Bld: 142 mg/dL — ABNORMAL HIGH (ref 70–99)
Potassium: 3.1 mmol/L — ABNORMAL LOW (ref 3.5–5.1)
Sodium: 134 mmol/L — ABNORMAL LOW (ref 135–145)
Total Bilirubin: 0.6 mg/dL (ref <1.2)
Total Protein: 7.5 g/dL (ref 6.5–8.1)

## 2023-03-22 LAB — CBC
HCT: 28.1 % — ABNORMAL LOW (ref 36.0–46.0)
Hemoglobin: 7.9 g/dL — ABNORMAL LOW (ref 12.0–15.0)
MCH: 17.6 pg — ABNORMAL LOW (ref 26.0–34.0)
MCHC: 28.1 g/dL — ABNORMAL LOW (ref 30.0–36.0)
MCV: 62.4 fL — ABNORMAL LOW (ref 80.0–100.0)
Platelets: 349 10*3/uL (ref 150–400)
RBC: 4.5 MIL/uL (ref 3.87–5.11)
RDW: 46.1 % — ABNORMAL HIGH (ref 11.5–15.5)
WBC: 6 10*3/uL (ref 4.0–10.5)
nRBC: 0 % (ref 0.0–0.2)

## 2023-03-22 LAB — URINALYSIS, ROUTINE W REFLEX MICROSCOPIC
Glucose, UA: NEGATIVE mg/dL
Hgb urine dipstick: NEGATIVE
Ketones, ur: 40 mg/dL — AB
Leukocytes,Ua: NEGATIVE
Nitrite: NEGATIVE
Protein, ur: NEGATIVE mg/dL
Specific Gravity, Urine: >=1.03 (ref 1.005–1.030)
pH: 6 (ref 5.0–8.0)

## 2023-03-22 LAB — WET PREP, GENITAL
Clue Cells Wet Prep HPF POC: NONE SEEN
Sperm: NONE SEEN
Trich, Wet Prep: NONE SEEN
WBC, Wet Prep HPF POC: <10 (ref <10)
Yeast Wet Prep HPF POC: NONE SEEN

## 2023-03-22 LAB — MAGNESIUM: Magnesium: 1.7 mg/dL (ref 1.7–2.4)

## 2023-03-22 LAB — PREGNANCY, URINE: Preg Test, Ur: NEGATIVE

## 2023-03-22 LAB — LIPASE, BLOOD: Lipase: 23 U/L (ref 11–51)

## 2023-03-22 MED ORDER — KETOROLAC TROMETHAMINE 15 MG/ML IJ SOLN
15.0000 mg | Freq: Once | INTRAMUSCULAR | Status: AC
Start: 2023-03-22 — End: 2023-03-22
  Administered 2023-03-22: 15 mg via INTRAVENOUS
  Filled 2023-03-22: qty 1

## 2023-03-22 MED ORDER — POTASSIUM CHLORIDE CRYS ER 20 MEQ PO TBCR: 20.0000 meq | EXTENDED_RELEASE_TABLET | Freq: Every day | ORAL | 0 refills | Status: AC

## 2023-03-22 MED ORDER — DIPHENHYDRAMINE HCL 50 MG/ML IJ SOLN
25.0000 mg | Freq: Once | INTRAMUSCULAR | Status: AC
Start: 2023-03-22 — End: 2023-03-22
  Administered 2023-03-22: 25 mg via INTRAVENOUS
  Filled 2023-03-22: qty 1

## 2023-03-22 MED ORDER — AMOXICILLIN-POT CLAVULANATE 875-125 MG PO TABS: 1.0000 | ORAL_TABLET | Freq: Two times a day (BID) | ORAL | 0 refills | Status: AC

## 2023-03-22 MED ORDER — AMOXICILLIN-POT CLAVULANATE 875-125 MG PO TABS
1.0000 | ORAL_TABLET | Freq: Once | ORAL | Status: AC
  Administered 2023-03-22: 1 via ORAL
  Filled 2023-03-22: qty 1

## 2023-03-22 MED ORDER — POTASSIUM CHLORIDE CRYS ER 20 MEQ PO TBCR
40.0000 meq | EXTENDED_RELEASE_TABLET | Freq: Once | ORAL | Status: AC
  Administered 2023-03-22: 40 meq via ORAL
  Filled 2023-03-22: qty 2

## 2023-03-22 MED ORDER — METOCLOPRAMIDE HCL 5 MG/ML IJ SOLN
10.0000 mg | Freq: Once | INTRAMUSCULAR | Status: AC
Start: 2023-03-22 — End: 2023-03-22
  Administered 2023-03-22: 10 mg via INTRAVENOUS
  Filled 2023-03-22: qty 2

## 2023-03-22 MED ORDER — FERROUS SULFATE 325 (65 FE) MG PO TABS: 325.0000 mg | ORAL_TABLET | Freq: Every day | ORAL | 2 refills | Status: AC

## 2023-03-22 NOTE — ED Triage Notes (Addendum)
Pt c/o dental pain (RT, upper) for some time, worse today; pt also c/o abd pain "for a while" and feeling faint at times (she thinks this could be anemia)

## 2023-03-22 NOTE — Discharge Instructions (Addendum)
As discussed, your episodes were low but not quite to the level transfusion.  Will begin you on iron segmentation in the outpatient setting.  The low hemoglobin is most likely from your menstrual cycle you just finished.  Will recommend repeat blood work in the next week or 2 for reassessment of your red blood cell levels by your primary care/OB/GYN.  If you develop shortness of breath, lightheadedness, dizziness or other abnormality we discussed, please return to either [facility] or [facility] as a red blood cells could be low enough to where we would give you blood.  Regarding her dental pain, recommend continue use of anti-inflammatories.  Will send you home with antibiotics for treatment of infection.  Keep upcoming appoint with dentist to have affected teeth addressed.  Your potassium was also low so we will give a few days for the potassium to take.  Please do not hesitate to return if the worrisome signs and symptoms we discussed become apparent.

## 2023-03-22 NOTE — ED Provider Notes (Signed)
[facility] Provider Note   CSN: [id number] Arrival date & time: 03/22/23  1119     History  Chief Complaint  Patient presents with   Dental Pain    Kimberly Morris is a 32 y.o. female.   Dental Pain   32 year old female presents emergency department with multiple different complaints.  Reports having right upper molar pain for the past couple of months.  Reports acute worsening over the past 3 to 4 days.  States he has a known fractured tooth in the area and has dentist appointment scheduled for later January to have tooth addressed.  Denies any difficulty breathing/swallowing, tongue/floor of mouth swelling, changes in voice.  States that as the tooth has caused her more pain, has developed right sided headache.  Reports sensitivity to light.  Has been managing headache with over-the-counter medications with relief of symptoms.  Has taken nothing for headache today.  Denies any visual disturbance, gait abnormality, slurred speech, facial droop, weakness/sensory deficits in upper or lower extremities.  Patient also complaining of abdominal pain.  Reports pain in upper middle abdomen again present for the past 2 months.  States that symptoms are improved when she drinks water.  States that she feels like she is dehydrated.  Also states that she feels like she may be anemic.  Reports craving ice more than usual.  Patient also complaining of vaginal discharge.  Has noticed for the past few days.  Describes it as thin white/opaque discharge.  States her last sexual intercourse was around a year ago and is not concerned about STDs.  Denies any pelvic pain, vaginal pain/itching.  Denies any urinary symptoms, nausea, vomiting, change in bowel habits.  Past medical history significant for anemia  Home Medications Prior to Admission medications   Medication Sig Start Date End Date Taking? Authorizing Provider  amoxicillin-clavulanate (AUGMENTIN) 875-125  MG tablet Take 1 tablet by mouth every 12 (twelve) hours. 03/22/23  Yes Sherian Maroon A, PA  ferrous sulfate 325 (65 FE) MG tablet Take 1 tablet (325 mg total) by mouth daily. 03/22/23  Yes Sherian Maroon A, PA  potassium chloride SA (KLOR-CON M) 20 MEQ tablet Take 1 tablet (20 mEq total) by mouth daily. 03/23/23  Yes Peter Garter, PA      Allergies    Patient has no known allergies.    Review of Systems   Review of Systems  All other systems reviewed and are negative.   Physical Exam Updated Vital Signs BP 134/79 (BP Location: Left Arm)   Pulse 74   Temp 98.6 F (37 C) (Oral)   Resp 18   Ht 5\' 3"  (1.6 m)   Wt 67.1 kg   LMP 03/15/2023   SpO2 100%   BMI 26.22 kg/m  Physical Exam Vitals and nursing note reviewed.  Constitutional:      General: She is not in acute distress.    Appearance: She is well-developed.  HENT:     Head: Normocephalic and atraumatic.     Mouth/Throat:      Comments: Tenderness along gingiva of affected tooth as above.  No obvious periapical abscess.  Uvula midline rise metrical phonation.  Tonsils 1+ bilaterally without exudate.  No sublingual or extremity swelling.  No trismus.  No changes in phonation per patient. Eyes:     Conjunctiva/sclera: Conjunctivae normal.  Cardiovascular:     Rate and Rhythm: Normal rate and regular rhythm.     Heart sounds: No murmur heard.  Pulmonary:     Effort: Pulmonary effort is normal. No respiratory distress.     Breath sounds: Normal breath sounds. No wheezing, rhonchi or rales.  Abdominal:     Palpations: Abdomen is soft.     Comments: Mild epigastric tenderness to deep palpation.  Musculoskeletal:        General: No swelling.     Cervical back: Neck supple.  Skin:    General: Skin is warm and dry.     Capillary Refill: Capillary refill takes less than 2 seconds.  Neurological:     Mental Status: She is alert.  Psychiatric:        Mood and Affect: Mood normal.     ED Results / Procedures /  Treatments   Labs (all labs ordered are listed, but only abnormal results are displayed) Labs Reviewed  COMPREHENSIVE METABOLIC PANEL - Abnormal; Notable for the following components:      Result Value   Sodium 134 (*)    Potassium 3.1 (*)    Glucose, Bld 142 (*)    BUN <5 (*)    Calcium 8.8 (*)    AST 14 (*)    All other components within normal limits  CBC - Abnormal; Notable for the following components:   Hemoglobin 7.9 (*)    HCT 28.1 (*)    MCV 62.4 (*)    MCH 17.6 (*)    MCHC 28.1 (*)    RDW 46.1 (*)    All other components within normal limits  URINALYSIS, ROUTINE W REFLEX MICROSCOPIC - Abnormal; Notable for the following components:   APPearance CLOUDY (*)    Bilirubin Urine SMALL (*)    Ketones, ur 40 (*)    All other components within normal limits  WET PREP, GENITAL  LIPASE, BLOOD  PREGNANCY, URINE  MAGNESIUM  GC/CHLAMYDIA PROBE AMP ([facility]) NOT AT [facility]    EKG None  Radiology No results found.  Procedures Procedures    Medications Ordered in ED Medications  ketorolac (TORADOL) 15 MG/ML injection 15 mg (15 mg Intravenous Given 03/22/23 1207)  metoCLOPramide (REGLAN) injection 10 mg (10 mg Intravenous Given 03/22/23 1207)  diphenhydrAMINE (BENADRYL) injection 25 mg (25 mg Intravenous Given 03/22/23 1207)  amoxicillin-clavulanate (AUGMENTIN) 875-125 MG per tablet 1 tablet (1 tablet Oral Given 03/22/23 1207)  potassium chloride SA (KLOR-CON M) CR tablet 40 mEq (40 mEq Oral Given 03/22/23 1331)    ED Course/ Medical Decision Making/ A&P                                 Medical Decision Making Amount and/or Complexity of Data Reviewed Labs: ordered.  Risk Prescription drug management.   This patient presents to the ED for concern of dental pain, vaginal discharge, abdominal pain, anemia, this involves an extensive number of treatment options, and is a complaint that carries with it a high risk of complications and morbidity.  The differential  diagnosis includes periapical abscess, Ludwig angina, cellulitis, necrotizing ulcerative gingivitis, gastritis, PUD, pancreatitis, CBD pathology, cholecystitis, diverticulitis, appendicitis, proctitis, nephrolithiasis, volvulus, SBO/LBO, tubo-ovarian abscess, ectopic pregnancy, PID, STD, BV, anemia, other   Co morbidities that complicate the patient evaluation  See HPI   Additional history obtained:  Additional history obtained from EMR External records from outside source obtained and reviewed including hospital records   Lab Tests:  I Ordered, and personally interpreted labs.  The pertinent results include: Electra abnormalities including hyponatremia, hypokalemia,  hypocalcemia 134, 3.1 and 8.8 respectively.  No transaminitis.  No renal dysfunction.  UA with ketones, small bilirubin present but otherwise unremarkable.  Wet prep negative.  Urine pregnancy negative.  Lipase within normal limits.  GC/chlamydia pending.  No leukocytosis.  Anemia with hemoglobin is a 0.9 of which is microcytic in nature.  Platelets within range.   Imaging Studies ordered:  N/a   Cardiac Monitoring: / EKG:  The patient was maintained on a cardiac monitor.  I personally viewed and interpreted the cardiac monitored which showed an underlying rhythm of: Sinus rhythm   Consultations Obtained:  N/a   Problem List / ED Course / Critical interventions / Medication management  Dental pain, anemia, hypokalemia, headache I ordered medication including Reglan, Toradol, Benadryl, Augmentin Reevaluation of the patient after these medicines showed that the patient improved I have reviewed the patients home medicines and have made adjustments as needed   Social Determinants of Health:  Denies tobacco, illicit drug use.   Test / Admission - Considered:  Dental pain, anemia, hypokalemia, headache Vitals signs within normal range and stable throughout visit. Laboratory/imaging studies significant for:  See above 32 year old female presents emergency department with myriad complaints.  Patient initial complaint with right upper molar pain for the past couple of months.  Known fractured tooth.  On exam, patient with gingival tenderness without obvious abscess.  No clinical evidence of Ludwig angina.  Tolerating p.o.  Will place patient on antibiotics and recommend follow-up with dentistry.  Patient with subsequent right-sided headache has dental pain is increased.  Nonfocal neuroexam.  No fever, evidence of meningismus to suggest meningitis/encephalitis.  Treated with migraine cocktail and noted resolution.  Likely migraine secondary to dental pain.  Regarding abdominal discomfort, slight tenderness in epigastric region to deep palpation.  Labs reassuring.  Noted resolution of symptoms with migraine cocktail with subsequent passage of p.o.  Symptoms could be secondary to GERD versus gastritis.  Pain seems to be intermittent and unchanged over the past couple of months.  Doubt acute emergent process.  Regarding feeling as if she were anemic, patient does state that she was craving ice more than usual.  Hemoglobin 7.9 today.  Patient just recently finished her menstrual cycle yesterday and has not had any iron supplementation in the outpatient setting.  Denies any lightheadedness, dizziness, shortness of breath.  Will begin iron supplementation recommend repeat laboratory studies in the next week to 2 weeks by PCP/OB/GYN.  Treatment plan discussed at length with patient and she acknowledged understanding was agreeable to said plan.  Patient overall well-appearing, afebrile in no acute distress. Worrisome signs and symptoms were discussed with the patient, and the patient acknowledged understanding to return to the ED if noticed. Patient was stable upon discharge.          Final Clinical Impression(s) / ED Diagnoses Final diagnoses:  Pain, dental  Anemia, unspecified type  Hypokalemia    Rx / DC  Orders ED Discharge Orders          Ordered    potassium chloride SA (KLOR-CON M) 20 MEQ tablet  Daily        03/22/23 1518    amoxicillin-clavulanate (AUGMENTIN) 875-125 MG tablet  Every 12 hours        03/22/23 1518    ferrous sulfate 325 (65 FE) MG tablet  Daily        03/22/23 1518              Peter Garter, [place] 03/22/23 [phone]  Durwin Glaze, MD 03/26/23 [phone]

## 2023-03-23 LAB — GC/CHLAMYDIA PROBE AMP (~~LOC~~) NOT AT ARMC
Chlamydia: NEGATIVE
Comment: NEGATIVE
Comment: NORMAL
Neisseria Gonorrhea: NEGATIVE
